# Patient Record
Sex: Female | Born: 1985
Health system: Southern US, Community
[De-identification: ages and names within clinical notes are randomized; demographics above are authoritative.]

## PROBLEM LIST (undated history)

## (undated) DIAGNOSIS — R569 Unspecified convulsions: Secondary | ICD-10-CM

## (undated) DIAGNOSIS — J45909 Unspecified asthma, uncomplicated: Secondary | ICD-10-CM

## (undated) DIAGNOSIS — F419 Anxiety disorder, unspecified: Secondary | ICD-10-CM

## (undated) DIAGNOSIS — U071 COVID-19: Secondary | ICD-10-CM

## (undated) DIAGNOSIS — Z8619 Personal history of other infectious and parasitic diseases: Secondary | ICD-10-CM

## (undated) DIAGNOSIS — N390 Urinary tract infection, site not specified: Secondary | ICD-10-CM

## (undated) DIAGNOSIS — Z8669 Personal history of other diseases of the nervous system and sense organs: Secondary | ICD-10-CM

## (undated) DIAGNOSIS — Z87410 Personal history of cervical dysplasia: Secondary | ICD-10-CM

## (undated) HISTORY — DX: Personal history of other diseases of the nervous system and sense organs: Z86.69

## (undated) HISTORY — DX: Urinary tract infection, site not specified: N39.0

## (undated) HISTORY — DX: Unspecified asthma, uncomplicated: J45.909

## (undated) HISTORY — DX: COVID-19: U07.1

## (undated) HISTORY — DX: Personal history of cervical dysplasia: Z87.410

## (undated) HISTORY — DX: Personal history of other infectious and parasitic diseases: Z86.19

## (undated) HISTORY — PX: WISDOM TOOTH EXTRACTION: SHX21

## (undated) HISTORY — DX: Anxiety disorder, unspecified: F41.9

## (undated) HISTORY — PX: TONSILLECTOMY: SHX5217

## (undated) HISTORY — DX: Unspecified convulsions: R56.9

---

## 2006-11-18 ENCOUNTER — Emergency Department: Payer: Self-pay | Admitting: Emergency Medicine

## 2008-10-26 ENCOUNTER — Emergency Department: Payer: Self-pay | Admitting: Emergency Medicine

## 2013-11-16 ENCOUNTER — Ambulatory Visit: Payer: BC Managed Care – PPO | Admitting: Family Medicine

## 2013-11-16 VITALS — BP 102/60 | HR 75 | Temp 99.4°F | Resp 18 | Ht 63.0 in | Wt 122.0 lb

## 2013-11-16 DIAGNOSIS — J329 Chronic sinusitis, unspecified: Secondary | ICD-10-CM

## 2013-11-16 MED ORDER — AMOXICILLIN 875 MG PO TABS: 875.0000 mg | ORAL_TABLET | Freq: Two times a day (BID) | ORAL | Status: DC

## 2013-11-16 NOTE — Progress Notes (Signed)
This chart was scribed for Elvina Sidle, MD by Arlan Organ, ED Scribe. This patient was seen in room Room 3 and the patient's care was started 10:50 AM.    Sarah West MRN: [id number], DOB: 1986-06-28, 28 y.o. Date of Encounter: 11/16/2013, 10:46 AM  Primary Physician: Provider Not In System  Chief Complaint:  Chief Complaint  Patient presents with  . head congestion and pressure    green drainage x6 days     HPI: 27 y.o. year old female presents with a 7 day history of nasal congestion, post nasal drip. Mild sinus pressure. Afebrile. No chills. Nasal congestion thick and green. Ears feel full, leading to sensation of muffled hearing. Has tried OTC cold preps mild success. No GI complaints. Appetite normal. She states she recalls these symptoms during the colder seasons.  No recent antibiotics, or recent travels. Pt states her Mother is experiencing similar symptoms.   No leg trauma, sedentary periods, h/o cancer. Pt states she smokes tobacco socially.  Pt is currently a hair stylist. History reviewed. No pertinent past medical history.   Home Meds: Prior to Admission medications   Medication Sig Start Date End Date Taking? Authorizing Provider  clonazePAM (KLONOPIN) 1 MG tablet Take 1 mg by mouth 2 (two) times daily.   Yes Historical Provider, MD    Allergies: No Known Allergies  History   Social History  . Marital Status: Married    Spouse Name: N/A    Number of Children: N/A  . Years of Education: N/A   Occupational History  . Not on file.   Social History Main Topics  . Smoking status: Never Smoker   . Smokeless tobacco: Not on file  . Alcohol Use: Not on file  . Drug Use: Not on file  . Sexual Activity: Not on file   Other Topics Concern  . Not on file   Social History Narrative  . No narrative on file     Review of Systems: Constitutional: negative for chills, fever, night sweats or weight changes Cardiovascular: negative for chest pain or  palpitations HENT: Positive for nasal congestion Respiratory: negative for hemoptysis, wheezing, or shortness of breath Abdominal: negative for abdominal pain, nausea, vomiting or diarrhea Dermatological: negative for rash Neurologic: negative for headache   Physical Exam: Blood pressure 102/60, pulse 75, temperature 99.4 F (37.4 C), temperature source Oral, resp. rate 18, height 5\' 3"  (1.6 m), weight 122 lb (55.339 kg), last menstrual period 11/04/2013, SpO2 100.00%., Body mass index is 21.62 kg/(m^2). General: Well developed, well nourished, in no acute distress. Head: Normocephalic, atraumatic, eyes without discharge, sclera non-icteric, nares are congested. Bilateral auditory canals clear, TM's are without perforation, pearly grey with reflective cone of light bilaterally. No sinus TTP. Oral cavity moist, dentition normal. Posterior pharynx with post nasal drip and mild erythema. No peritonsillar abscess or tonsillar exudate. Neck: Supple. No thyromegaly. Full ROM. No lymphadenopathy. Lungs: Coarse breath sounds bilaterally without wheezes, rales, or rhonchi. Breathing is unlabored.  Heart: RRR with S1 S2. No murmurs, rubs, or gallops appreciated. Msk:  Strength and tone normal for age. Extremities: No clubbing or cyanosis. No edema. Neuro: Alert and oriented X 3. Moves all extremities spontaneously. CNII-XII grossly in tact. Psych:  Responds to questions appropriately with a normal affect.  Nose reveals bilateral nasal congestion with hyperemia and diffuse scattered mucopurulent exudates   ASSESSMENT AND PLAN:  27 y.o. year old female with sinusitis Sinusitis - Plan: amoxicillin (AMOXIL) 875 MG tablet  - -Tylenol/Motrin prn -Rest/fluids -  RTC precautions -RTC 3-5 days if no improvement  Signed, Elvina Sidle, MD 11/16/2013 10:46 AM

## 2013-11-16 NOTE — Patient Instructions (Signed)

## 2016-10-21 ENCOUNTER — Ambulatory Visit: Payer: Self-pay | Admitting: Family

## 2016-10-21 VITALS — BP 120/70 | HR 78 | Temp 98.6°F

## 2016-10-21 DIAGNOSIS — J028 Acute pharyngitis due to other specified organisms: Principal | ICD-10-CM

## 2016-10-21 DIAGNOSIS — J309 Allergic rhinitis, unspecified: Secondary | ICD-10-CM | POA: Insufficient documentation

## 2016-10-21 DIAGNOSIS — B9789 Other viral agents as the cause of diseases classified elsewhere: Secondary | ICD-10-CM

## 2016-10-21 MED ORDER — MONTELUKAST SODIUM 10 MG PO TABS: 10.0000 mg | ORAL_TABLET | Freq: Every day | ORAL | 11 refills | Status: DC

## 2016-10-21 MED ORDER — FLUTICASONE PROPIONATE 50 MCG/ACT NA SUSP: 2.0000 | Freq: Every day | NASAL | 11 refills | Status: DC

## 2016-10-21 NOTE — Progress Notes (Signed)
S/ sore throat , hurts to swallow . Chronic year round allergies , no meds, malaise , denies URI , resp or GI sxs  PMH anxiety, taking hydroxizine for allergies and anxiety Hx of tonsilectomy remote O/ VSS ENT tms clear, nasal turbinates allergic  , pharynx one viral ulcer R side inflamed. Neck supple heart rsr lungs clear  A/ allergic rhinitis, viral pharyngitis P/  allergy tips reviewed and encouraged rx flonase , singulair . Supportive measures discussed. Follow up prn not improving   Analgesic, gargles, chloroseptic losenges.

## 2017-06-26 ENCOUNTER — Encounter: Payer: Self-pay | Admitting: Obstetrics and Gynecology

## 2017-06-30 ENCOUNTER — Encounter: Payer: Self-pay | Admitting: Obstetrics and Gynecology

## 2017-07-04 ENCOUNTER — Encounter: Payer: Self-pay | Admitting: Obstetrics and Gynecology

## 2017-07-04 ENCOUNTER — Ambulatory Visit (INDEPENDENT_AMBULATORY_CARE_PROVIDER_SITE_OTHER): Payer: 59 | Admitting: Obstetrics and Gynecology

## 2017-07-04 VITALS — BP 115/77 | HR 76 | Ht 62.0 in | Wt 147.0 lb

## 2017-07-04 DIAGNOSIS — Z87898 Personal history of other specified conditions: Secondary | ICD-10-CM

## 2017-07-04 DIAGNOSIS — Z01419 Encounter for gynecological examination (general) (routine) without abnormal findings: Secondary | ICD-10-CM | POA: Diagnosis not present

## 2017-07-04 DIAGNOSIS — Z720 Tobacco use: Secondary | ICD-10-CM

## 2017-07-04 DIAGNOSIS — Z8742 Personal history of other diseases of the female genital tract: Secondary | ICD-10-CM
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
## 2017-07-04 NOTE — Patient Instructions (Signed)

## 2017-07-05 ENCOUNTER — Encounter: Payer: Self-pay | Admitting: Obstetrics and Gynecology

## 2017-07-05 LAB — COMPREHENSIVE METABOLIC PANEL
ALT: 13 IU/L (ref 0–32)
AST: 16 IU/L (ref 0–40)
Albumin/Globulin Ratio: 1.9 (ref 1.2–2.2)
Albumin: 4.8 g/dL (ref 3.5–5.5)
Alkaline Phosphatase: 62 IU/L (ref 39–117)
BUN/Creatinine Ratio: 17 (ref 9–23)
BUN: 11 mg/dL (ref 6–20)
Bilirubin Total: 0.5 mg/dL (ref 0.0–1.2)
CO2: 22 mmol/L (ref 20–29)
CREATININE: 0.63 mg/dL (ref 0.57–1.00)
Calcium: 9.5 mg/dL (ref 8.7–10.2)
Chloride: 100 mmol/L (ref 96–106)
GFR calc Af Amer: 137 mL/min/{1.73_m2} (ref >59)
GFR, EST NON AFRICAN AMERICAN: 119 mL/min/{1.73_m2} (ref >59)
Globulin, Total: 2.5 g/dL (ref 1.5–4.5)
Glucose: 92 mg/dL (ref 65–99)
Potassium: 4.3 mmol/L (ref 3.5–5.2)
Sodium: 136 mmol/L (ref 134–144)
Total Protein: 7.3 g/dL (ref 6.0–8.5)

## 2017-07-05 LAB — CBC
Hematocrit: 41.5 % (ref 34.0–46.6)
Hemoglobin: 13.8 g/dL (ref 11.1–15.9)
MCH: 30.5 pg (ref 26.6–33.0)
MCHC: 33.3 g/dL (ref 31.5–35.7)
MCV: 92 fL (ref 79–97)
PLATELETS: 305 10*3/uL (ref 150–379)
RBC: 4.53 x10E6/uL (ref 3.77–5.28)
RDW: 13.6 % (ref 12.3–15.4)
WBC: 6.7 10*3/uL (ref 3.4–10.8)

## 2017-07-06 LAB — PAP IG AND HPV HIGH-RISK
HPV, high-risk: NEGATIVE
PAP Smear Comment: 0

## 2017-08-16 ENCOUNTER — Encounter: Payer: Self-pay | Admitting: Physician Assistant

## 2017-08-16 ENCOUNTER — Ambulatory Visit: Payer: Self-pay | Admitting: Physician Assistant

## 2017-08-16 VITALS — BP 112/68 | HR 87 | Temp 98.6°F

## 2017-08-16 DIAGNOSIS — H6983 Other specified disorders of Eustachian tube, bilateral: Secondary | ICD-10-CM

## 2017-08-16 MED ORDER — PREDNISONE 10 MG PO TABS: 30.0000 mg | ORAL_TABLET | Freq: Every day | ORAL | 0 refills | Status: DC

## 2017-08-16 NOTE — Progress Notes (Signed)
S: states she has diminished hearing, ?if wax build up or going deaf, states she has trouble with men's voices due to low tone, high tones are easier to hear, no drainage from the ears, no recent injury, no runny nose or congestion, gets a cough at night  O: vitals wnl, nad, tms dull b/l, no wax in ear canals, throat wnl, neck supple no lymph, lungs c t a, cv rrr, pt appears confused about instructions and had to repeat them several times  A: eustachean tube dysfunction, decreased hearing  P: pred 30mg  qd x 3d, flonase, sudafed

## 2017-12-13 ENCOUNTER — Ambulatory Visit: Payer: Self-pay | Admitting: Emergency Medicine

## 2017-12-13 VITALS — BP 110/80 | HR 76 | Temp 98.5°F

## 2017-12-13 DIAGNOSIS — J029 Acute pharyngitis, unspecified: Secondary | ICD-10-CM

## 2017-12-13 LAB — POCT RAPID STREP A (OFFICE): RAPID STREP A SCREEN: NEGATIVE

## 2017-12-13 MED ORDER — BENZONATATE 100 MG PO CAPS: 100.0000 mg | ORAL_CAPSULE | Freq: Three times a day (TID) | ORAL | 0 refills | Status: DC | PRN

## 2017-12-13 NOTE — Progress Notes (Signed)
Subjective. One week ago patient started with a problem with sinus congestion and a tickle in her throat. She had episodes of sneezing and continues to have the postnasal drip which causes a severe cough. Her cough is much worse at night. She has tried NyQuil with no relief. Her husband had a similar illness but he is better. She is a smoker down to 45 cigarettes a day now. She has an essentially nonproductive cough. Objective. HEENT exam is unremarkable. TMs are clear nose is normal. Neck is supple with mild right cervical adenopathy. Chest is clear to auscultation and percussion. Assessment. Patient has had a bad cough primarily at night. She has not responded to over-the-counter medication. I did pull up the PMP aware information and she is not on any narcotics. She was on alprazolam until September of last year.. Plan.  We'll check a strep test. She was given 2 ounces of Tussionex to take 1 teaspoon at bedtime. She will have Tessalon pearls for cough.

## 2018-02-23 ENCOUNTER — Telehealth: Payer: Self-pay | Admitting: Family

## 2018-02-23 DIAGNOSIS — N390 Urinary tract infection, site not specified: Secondary | ICD-10-CM

## 2018-02-23 MED ORDER — CEPHALEXIN 500 MG PO CAPS: 500.0000 mg | ORAL_CAPSULE | Freq: Two times a day (BID) | ORAL | 0 refills | Status: DC

## 2018-02-23 NOTE — Progress Notes (Signed)

## 2018-03-29 ENCOUNTER — Telehealth: Payer: 59 | Admitting: Family

## 2018-03-29 ENCOUNTER — Encounter: Payer: Self-pay | Admitting: Obstetrics and Gynecology

## 2018-03-29 DIAGNOSIS — N390 Urinary tract infection, site not specified: Secondary | ICD-10-CM

## 2018-03-29 MED ORDER — NITROFURANTOIN MONOHYD MACRO 100 MG PO CAPS: 100.0000 mg | ORAL_CAPSULE | Freq: Two times a day (BID) | ORAL | 0 refills | Status: DC

## 2018-03-29 NOTE — Progress Notes (Signed)
Thank you for the details you included in the comment boxes. Those details are very helpful in determining the best course of treatment for you and help us to provide the best care. We can try a macrolide antibiotic as this may be more effective in your case.   We are sorry that you are not feeling well.  Here is how we plan to help!  Based on what you shared with me it looks like you most likely have a simple urinary tract infection.  A UTI (Urinary Tract Infection) is a bacterial infection of the bladder.  Most cases of urinary tract infections are simple to treat but a key part of your care is to encourage you to drink plenty of fluids and watch your symptoms carefully.  I have prescribed MacroBid 100 mg twice a day for 5 days.  Your symptoms should gradually improve. Call us if the burning in your urine worsens, you develop worsening fever, back pain or pelvic pain or if your symptoms do not resolve after completing the antibiotic.  Urinary tract infections can be prevented by drinking plenty of water to keep your body hydrated.  Also be sure when you wipe, wipe from front to back and don't hold it in!  If possible, empty your bladder every 4 hours.  Your e-visit answers were reviewed by a board certified advanced clinical practitioner to complete your personal care plan.  Depending on the condition, your plan could have included both over the counter or prescription medications.  If there is a problem please reply  once you have received a response from your provider.  Your safety is important to us.  If you have drug allergies check your prescription carefully.    You can use MyChart to ask questions about today's visit, request a non-urgent call back, or ask for a work or school excuse for 24 hours related to this e-Visit. If it has been greater than 24 hours you will need to follow up with your provider, or enter a new e-Visit to address those concerns.   You will get an e-mail in the  next two days asking about your experience.  I hope that your e-visit has been valuable and will speed your recovery. Thank you for using e-visits.

## 2018-04-05 ENCOUNTER — Encounter: Payer: Self-pay | Admitting: Obstetrics and Gynecology

## 2018-04-24 ENCOUNTER — Ambulatory Visit (INDEPENDENT_AMBULATORY_CARE_PROVIDER_SITE_OTHER): Payer: 59 | Admitting: Obstetrics and Gynecology

## 2018-04-24 ENCOUNTER — Encounter: Payer: Self-pay | Admitting: Obstetrics and Gynecology

## 2018-04-24 ENCOUNTER — Encounter: Payer: Self-pay | Admitting: *Deleted

## 2018-04-24 VITALS — BP 103/68 | HR 79 | Ht 62.0 in | Wt 144.6 lb

## 2018-04-24 DIAGNOSIS — Z1389 Encounter for screening for other disorder: Secondary | ICD-10-CM

## 2018-04-24 DIAGNOSIS — R300 Dysuria: Secondary | ICD-10-CM

## 2018-04-24 LAB — POCT URINALYSIS DIPSTICK
BILIRUBIN UA: NEGATIVE
GLUCOSE UA: NEGATIVE
Ketones, UA: NEGATIVE
Leukocytes, UA: NEGATIVE
Nitrite, UA: NEGATIVE
RBC UA: NEGATIVE
SPEC GRAV UA: 1.02 (ref 1.010–1.025)
Urobilinogen, UA: 0.2 E.U./dL
pH, UA: 6.5 (ref 5.0–8.0)

## 2018-04-24 NOTE — Patient Instructions (Signed)

## 2018-04-24 NOTE — Progress Notes (Signed)
    GYNECOLOGY PROGRESS NOTE  Subjective:    Patient ID: Sarah West, female    DOB: Jul 09, 1986, 32 y.o.   MRN: [id number]  HPI  Patient is a 32 y.o. G0P0000 female who presents for complaints of chronic dysuria. She notes that over the past 2-3 months she has noted pain with urination.  Pain is mostly noted at time of urination, relieved after voiding.  She has taken several rounds of prophylactic antibiotics for suspected UTI's (did Telemedicine visits).  Notes that she usually feels better for several days to a week after treatment, but then symptoms return.  She has increased fluids, taken cranberry extract, and has used Pyridium in the past without relief.  She also was given a prescription for Diflucan to see if this would treat, but no relief. Denies hematuria.   The following portions of the patient's history were reviewed and updated as appropriate: allergies, current medications, past family history, past medical history, past social history, past surgical history and problem list.  Review of Systems Pertinent items noted in HPI and remainder of comprehensive ROS otherwise negative.   Objective:   Blood pressure 103/68, pulse 79, height  (1.575 m), weight 144 lb 9.6 oz (65.6 kg), last menstrual period 04/14/2018. General appearance: alert and no distress Abdomen: soft, non-tender, no masses Back: no CVA tenderness, normal curvature.     Labs:  Results for orders placed or performed in visit on 04/24/18  POCT urinalysis dipstick  Result Value Ref Range   Color, UA yellow    Clarity, UA clear    Glucose, UA neg    Bilirubin, UA neg    Ketones, UA neg    Spec Grav, UA 1.020 1.010 - 1.025   Blood, UA neg    pH, UA 6.5 5.0 - 8.0   Protein, UA trace    Urobilinogen, UA 0.2 0.2 or 1.0 E.U./dL   Nitrite, UA neg    Leukocytes, UA Negative Negative   Appearance yellow    Odor       Assessment:   Dysuria (chronic)  Plan:   - Patient with chronic dysuria, has  been treated prophylactically for UTI's at least 2-3 times in the past several months, with no relief in symptoms. Has also used cranberry extract, pyridium, and increased hydration without relief.  UA today with no pathogens. Although negative UA, will still send culture due to persistent nature of symptoms. Will assess for possible genitourinary infections, including GC/Cl. Will also place referral to Urology.    Hildred Laser, MD [facility]

## 2018-04-24 NOTE — Progress Notes (Signed)
Pt think she may have a UTI have token several antibiotics but they are not helping still having burning with urination x 3 months. Pt took Diflucan once help with itching of the vaginal area but still has burning with urination.

## 2018-05-23 DIAGNOSIS — R42 Dizziness and giddiness: Secondary | ICD-10-CM | POA: Diagnosis not present

## 2018-05-24 ENCOUNTER — Encounter: Payer: Self-pay | Admitting: Neurology

## 2018-05-28 ENCOUNTER — Encounter: Payer: Self-pay | Admitting: Internal Medicine

## 2018-05-28 ENCOUNTER — Ambulatory Visit (INDEPENDENT_AMBULATORY_CARE_PROVIDER_SITE_OTHER): Payer: 59 | Admitting: Internal Medicine

## 2018-05-28 VITALS — BP 118/68 | HR 71 | Temp 98.5°F | Ht 62.0 in | Wt 140.0 lb

## 2018-05-28 DIAGNOSIS — G43909 Migraine, unspecified, not intractable, without status migrainosus: Secondary | ICD-10-CM | POA: Insufficient documentation

## 2018-05-28 DIAGNOSIS — F411 Generalized anxiety disorder: Secondary | ICD-10-CM

## 2018-05-28 DIAGNOSIS — R5383 Other fatigue: Secondary | ICD-10-CM | POA: Diagnosis not present

## 2018-05-28 DIAGNOSIS — G43809 Other migraine, not intractable, without status migrainosus: Secondary | ICD-10-CM

## 2018-05-28 DIAGNOSIS — Z13220 Encounter for screening for lipoid disorders: Secondary | ICD-10-CM | POA: Diagnosis not present

## 2018-05-28 DIAGNOSIS — H547 Unspecified visual loss: Secondary | ICD-10-CM | POA: Diagnosis not present

## 2018-05-28 DIAGNOSIS — E559 Vitamin D deficiency, unspecified: Secondary | ICD-10-CM

## 2018-05-28 DIAGNOSIS — Z1329 Encounter for screening for other suspected endocrine disorder: Secondary | ICD-10-CM

## 2018-05-28 DIAGNOSIS — R300 Dysuria: Secondary | ICD-10-CM

## 2018-05-28 DIAGNOSIS — Z0000 Encounter for general adult medical examination without abnormal findings: Secondary | ICD-10-CM

## 2018-05-28 DIAGNOSIS — R110 Nausea: Secondary | ICD-10-CM | POA: Diagnosis not present

## 2018-05-28 HISTORY — DX: Generalized anxiety disorder: F41.1

## 2018-05-28 LAB — LIPID PANEL
CHOL/HDL RATIO: 3
Cholesterol: 164 mg/dL (ref 0–200)
HDL: 50 mg/dL (ref >39.00)
LDL Cholesterol: 100 mg/dL — ABNORMAL HIGH (ref 0–99)
NONHDL: 114.11
TRIGLYCERIDES: 72 mg/dL (ref 0.0–149.0)
VLDL: 14.4 mg/dL (ref 0.0–40.0)

## 2018-05-28 LAB — COMPREHENSIVE METABOLIC PANEL
ALT: 12 U/L (ref 0–35)
AST: 13 U/L (ref 0–37)
Albumin: 4.4 g/dL (ref 3.5–5.2)
Alkaline Phosphatase: 57 U/L (ref 39–117)
BUN: 11 mg/dL (ref 6–23)
CHLORIDE: 103 meq/L (ref 96–112)
CO2: 27 mEq/L (ref 19–32)
Calcium: 9.6 mg/dL (ref 8.4–10.5)
Creatinine, Ser: 0.59 mg/dL (ref 0.40–1.20)
GFR: 124.71 mL/min (ref >60.00)
GLUCOSE: 93 mg/dL (ref 70–99)
POTASSIUM: 4 meq/L (ref 3.5–5.1)
SODIUM: 137 meq/L (ref 135–145)
Total Bilirubin: 0.4 mg/dL (ref 0.2–1.2)
Total Protein: 6.9 g/dL (ref 6.0–8.3)

## 2018-05-28 LAB — CBC WITH DIFFERENTIAL/PLATELET
BASOS PCT: 0.9 % (ref 0.0–3.0)
Basophils Absolute: 0.1 10*3/uL (ref 0.0–0.1)
EOS PCT: 4.5 % (ref 0.0–5.0)
Eosinophils Absolute: 0.3 10*3/uL (ref 0.0–0.7)
HCT: 40.1 % (ref 36.0–46.0)
HEMOGLOBIN: 13.4 g/dL (ref 12.0–15.0)
LYMPHS ABS: 2 10*3/uL (ref 0.7–4.0)
Lymphocytes Relative: 32.4 % (ref 12.0–46.0)
MCHC: 33.5 g/dL (ref 30.0–36.0)
MCV: 91.8 fl (ref 78.0–100.0)
MONO ABS: 0.4 10*3/uL (ref 0.1–1.0)
Monocytes Relative: 6.3 % (ref 3.0–12.0)
NEUTROS PCT: 55.9 % (ref 43.0–77.0)
Neutro Abs: 3.5 10*3/uL (ref 1.4–7.7)
Platelets: 336 10*3/uL (ref 150.0–400.0)
RBC: 4.36 Mil/uL (ref 3.87–5.11)
RDW: 13 % (ref 11.5–15.5)
WBC: 6.3 10*3/uL (ref 4.0–10.5)

## 2018-05-28 LAB — TSH: TSH: 1.26 u[IU]/mL (ref 0.35–4.50)

## 2018-05-28 LAB — POCT URINE PREGNANCY: Preg Test, Ur: NEGATIVE

## 2018-05-28 LAB — T4, FREE: Free T4: 1 ng/dL (ref 0.60–1.60)

## 2018-05-28 LAB — VITAMIN D 25 HYDROXY (VIT D DEFICIENCY, FRACTURES): VITD: 28.45 ng/mL — AB (ref 30.00–100.00)

## 2018-05-28 MED ORDER — ONDANSETRON HCL 4 MG PO TABS: 4.0000 mg | ORAL_TABLET | Freq: Three times a day (TID) | ORAL | 2 refills | Status: DC | PRN

## 2018-05-28 NOTE — Progress Notes (Signed)
Pre visit review using our clinic review tool, if applicable. No additional management support is needed unless otherwise documented below in the visit note. 
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
## 2018-05-28 NOTE — Patient Instructions (Addendum)
F/u in 1 month sooner if needed   Dysuria Dysuria is pain or discomfort while urinating. The pain or discomfort may be felt in the tube that carries urine out of the bladder (urethra) or in the surrounding tissue of the genitals. The pain may also be felt in the groin area, lower abdomen, and lower back. You may have to urinate frequently or have the sudden feeling that you have to urinate (urgency). Dysuria can affect both men and women, but is more common in women. Dysuria can be caused by many different things, including:  Urinary tract infection in women.  Infection of the kidney or bladder.  Kidney stones or bladder stones.  Certain sexually transmitted infections (STIs), such as chlamydia.  Dehydration.  Inflammation of the vagina.  Use of certain medicines.  Use of certain soaps or scented products that cause irritation.  Follow these instructions at home: Watch your dysuria for any changes. The following actions may help to reduce any discomfort you are feeling:  Drink enough fluid to keep your urine clear or pale yellow.  Empty your bladder often. Avoid holding urine for long periods of time.  After a bowel movement or urination, women should cleanse from front to back, using each tissue only once.  Empty your bladder after sexual intercourse.  Take medicines only as directed by your health care provider.  If you were prescribed an antibiotic medicine, finish it all even if you start to feel better.  Avoid caffeine, tea, and alcohol. They can irritate the bladder and make dysuria worse. In men, alcohol may irritate the prostate.  Keep all follow-up visits as directed by your health care provider. This is important.  If you had any tests done to find the cause of dysuria, it is your responsibility to obtain your test results. Ask the lab or department performing the test when and how you will get your results. Talk with your health care provider if you have any  questions about your results.  Contact a health care provider if:  You develop pain in your back or sides.  You have a fever.  You have nausea or vomiting.  You have blood in your urine.  You are not urinating as often as you usually do. Get help right away if:  You pain is severe and not relieved with medicines.  You are unable to hold down any fluids.  You or someone else notices a change in your mental function.  You have a rapid heartbeat at rest.  You have shaking or chills.  You feel extremely weak. This information is not intended to replace advice given to you by your health care provider. Make sure you discuss any questions you have with your health care provider. Document Released: 09/02/2004 Document Revised: 05/12/2016 Document Reviewed: 07/31/2014 Elsevier Interactive Patient Education  2018 [employer].  Dizziness Dizziness is a common problem. It is a feeling of unsteadiness or light-headedness. You may feel like you are about to faint. Dizziness can lead to injury if you stumble or fall. Anyone can become dizzy, but dizziness is more common in older adults. This condition can be caused by a number of things, including medicines, dehydration, or illness. Follow these instructions at home: Eating and drinking  Drink enough fluid to keep your urine clear or pale yellow. This helps to keep you from becoming dehydrated. Try to drink more clear fluids, such as water.  Do not drink alcohol.  Limit your caffeine intake if told to do  so by your health care provider. Check ingredients and nutrition facts to see if a food or beverage contains caffeine.  Limit your salt (sodium) intake if told to do so by your health care provider. Check ingredients and nutrition facts to see if a food or beverage contains sodium. Activity  Avoid making quick movements. ? Rise slowly from chairs and steady yourself until you feel okay. ? In the morning, first sit up on the side of  the bed. When you feel okay, stand slowly while you hold onto something until you know that your balance is fine.  If you need to stand in one place for a long time, move your legs often. Tighten and relax the muscles in your legs while you are standing.  Do not drive or use heavy machinery if you feel dizzy.  Avoid bending down if you feel dizzy. Place items in your home so that they are easy for you to reach without leaning over. Lifestyle  Do not use any products that contain nicotine or tobacco, such as cigarettes and e-cigarettes. If you need help quitting, ask your health care provider.  Try to reduce your stress level by using methods such as yoga or meditation. Talk with your health care provider if you need help to manage your stress. General instructions  Watch your dizziness for any changes.  Take over-the-counter and prescription medicines only as told by your health care provider. Talk with your health care provider if you think that your dizziness is caused by a medicine that you are taking.  Tell a friend or a family member that you are feeling dizzy. If he or she notices any changes in your behavior, have this person call your health care provider.  Keep all follow-up visits as told by your health care provider. This is important. Contact a health care provider if:  Your dizziness does not go away.  Your dizziness or light-headedness gets worse.  You feel nauseous.  You have reduced hearing.  You have new symptoms.  You are unsteady on your feet or you feel like the room is spinning. Get help right away if:  You vomit or have diarrhea and are unable to eat or drink anything.  You have problems talking, walking, swallowing, or using your arms, hands, or legs.  You feel generally weak.  You are not thinking clearly or you have trouble forming sentences. It may take a friend or family member to notice this.  You have chest pain, abdominal pain, shortness of  breath, or sweating.  Your vision changes.  You have any bleeding.  You have a severe headache.  You have neck pain or a stiff neck.  You have a fever. These symptoms may represent a serious problem that is an emergency. Do not wait to see if the symptoms will go away. Get medical help right away. Call your local emergency services (911 in the [place] Do not drive yourself to the hospital. Summary  Dizziness is a feeling of unsteadiness or light-headedness. This condition can be caused by a number of things, including medicines, dehydration, or illness.  Anyone can become dizzy, but dizziness is more common in older adults.  Drink enough fluid to keep your urine clear or pale yellow. Do not drink alcohol.  Avoid making quick movements if you feel dizzy. Monitor your dizziness for any changes. This information is not intended to replace advice given to you by your health care provider. Make sure you discuss any questions you  have with your health care provider. Document Released: 05/31/2001 Document Revised: 01/07/2017 Document Reviewed: 01/07/2017 Elsevier Interactive Patient Education  [employer].

## 2018-05-29 LAB — URINALYSIS, ROUTINE W REFLEX MICROSCOPIC
Bilirubin, UA: NEGATIVE
GLUCOSE, UA: NEGATIVE
KETONES UA: NEGATIVE
LEUKOCYTES UA: NEGATIVE
NITRITE UA: NEGATIVE
Protein, UA: NEGATIVE
RBC, UA: NEGATIVE
SPEC GRAV UA: 1.013 (ref 1.005–1.030)
Urobilinogen, Ur: 0.2 mg/dL (ref 0.2–1.0)
pH, UA: 8 — ABNORMAL HIGH (ref 5.0–7.5)

## 2018-05-30 LAB — URINE CULTURE

## 2018-05-31 ENCOUNTER — Ambulatory Visit (INDEPENDENT_AMBULATORY_CARE_PROVIDER_SITE_OTHER): Payer: 59 | Admitting: Urology

## 2018-05-31 ENCOUNTER — Encounter: Payer: Self-pay | Admitting: Urology

## 2018-05-31 VITALS — BP 112/80 | HR 93 | Ht 62.0 in | Wt 139.2 lb

## 2018-05-31 DIAGNOSIS — R300 Dysuria: Secondary | ICD-10-CM

## 2018-05-31 DIAGNOSIS — R109 Unspecified abdominal pain: Secondary | ICD-10-CM

## 2018-05-31 LAB — MICROSCOPIC EXAMINATION
RBC, UA: NONE SEEN /hpf (ref 0–2)
WBC UA: NONE SEEN /HPF (ref 0–5)

## 2018-05-31 LAB — URINALYSIS, COMPLETE
BILIRUBIN UA: NEGATIVE
GLUCOSE, UA: NEGATIVE
KETONES UA: NEGATIVE
Leukocytes, UA: NEGATIVE
Nitrite, UA: NEGATIVE
Protein, UA: NEGATIVE
RBC UA: NEGATIVE
SPEC GRAV UA: 1.015 (ref 1.005–1.030)
UUROB: 0.2 mg/dL (ref 0.2–1.0)
pH, UA: 6 (ref 5.0–7.5)

## 2018-06-01 ENCOUNTER — Encounter: Payer: Self-pay | Admitting: Internal Medicine

## 2018-06-01 NOTE — Progress Notes (Signed)
05/31/2018 6:51 AM   [name] [birth year] [id number]  Referring provider: Hildred Laserherry, Anika, MD [address] [facility] [address] [place], [place] [id number]  Chief Complaint  Patient presents with  . Dysuria    HPI: Sarah West is a 32 year old female seen in consultation at the request of Dr. Valentino Saxonherry for evaluation of chronic dysuria.  She states shortly after the first of the year she had onset of dysuria.  She only has symptoms when voiding and complains of burning.  She has mild suprapubic discomfort.  She has taken cranberry tablets, AZO, Pyridium without relief.  She has also been treated empirically with antibiotics and Diflucan without relief of her symptoms.  She denies gross hematuria.  Last week she began to note right flank pain with some radiation to the right lower quadrant.  There is no previous history of urologic problems.  Urinalyses and urine cultures have been negative.  She denies dyspareunia or urinary frequency/urgency.  Gynecologic evaluation has been negative.   PMH: Past Medical History:  Diagnosis Date  . Anxiety   . Asthma   . History of cervical dysplasia    s/p cryotherapy 8 years ago  . History of genital warts   . Hx of migraines    teenager   . Seizure (HCC)    grand mal  . UTI (urinary tract infection)     Surgical History: Past Surgical History:  Procedure Laterality Date  . TONSILLECTOMY     2009  . WISDOM TOOTH EXTRACTION      Home Medications:  Allergies as of 05/31/2018   No Known Allergies     Medication List        Accurate as of 05/31/18 11:59 PM. Always use your most recent med list.          meclizine 25 MG tablet Commonly known as:  ANTIVERT Take 25 mg by mouth 3 (three) times daily.   ondansetron 4 MG tablet Commonly known as:  ZOFRAN Take 1 tablet (4 mg total) by mouth every 8 (eight) hours as needed for nausea or vomiting.       Allergies: No Known Allergies  Family History: Family History  Problem  Relation Age of Onset  . Asthma Mother   . Breast cancer Maternal Grandmother   . Cancer Maternal Grandmother        breast  . Stroke Maternal Grandmother   . Heart disease Paternal Grandfather   . Cancer Maternal Aunt   . Cervical cancer Other     Social History:  reports that she has been smoking cigarettes.  She has been smoking about 0.25 packs per day. She has never used smokeless tobacco. She reports that she drinks about 1.2 oz of alcohol per week. She reports that she does not use drugs.  ROS: UROLOGY Frequent Urination?: No Hard to postpone urination?: No Burning/pain with urination?: Yes Get up at night to urinate?: Yes Leakage of urine?: No Urine stream starts and stops?: No Trouble starting stream?: No Do you have to strain to urinate?: Yes Blood in urine?: No Urinary tract infection?: Yes Sexually transmitted disease?: No Injury to kidneys or bladder?: No Painful intercourse?: No Weak stream?: No Currently pregnant?: No Vaginal bleeding?: No Last menstrual period?: 05/23/2018  Gastrointestinal Nausea?: No Vomiting?: No Indigestion/heartburn?: No Diarrhea?: Yes Constipation?: Yes  Constitutional Fever: No Night sweats?: Yes Weight loss?: No Fatigue?: Yes  Skin Skin rash/lesions?: No Itching?: No  Eyes Blurred vision?: Yes Double vision?: No  Ears/Nose/Throat Sore throat?:  Yes Sinus problems?: Yes  Hematologic/Lymphatic Swollen glands?: Yes Easy bruising?: Yes  Cardiovascular Leg swelling?: No Chest pain?: No  Respiratory Cough?: Yes Shortness of breath?: No  Endocrine Excessive thirst?: No  Musculoskeletal Back pain?: Yes Joint pain?: Yes  Neurological Headaches?: Yes Dizziness?: Yes  Psychologic Depression?: No Anxiety?: Yes  Physical Exam: BP 112/80 (BP Location: Left Arm, Patient Position: Sitting, Cuff Size: Normal)   Pulse 93   Ht 5\' 2"  (1.575 m)   Wt 139 lb 3.2 oz (63.1 kg)   LMP 05/23/2018   BMI 25.46 kg/m     Constitutional:  Alert and oriented, No acute distress. HEENT: [place] AT, moist mucus membranes.  Trachea midline, no masses. Cardiovascular: No clubbing, cyanosis, or edema. Respiratory: Normal respiratory effort, no increased work of breathing. GI: Abdomen is soft, nontender, nondistended, no abdominal masses GU: No CVA tenderness Lymph: No cervical or inguinal lymphadenopathy. Skin: No rashes, bruises or suspicious lesions. Neurologic: Grossly intact, no focal deficits, moving all 4 extremities. Psychiatric: Normal mood and affect.  Laboratory Data:  Urinalysis Dipstick/microscopy negative   Assessment & Plan:   32 year old female with chronic dysuria.  Urinalysis today is unremarkable.  She has recently developed some right flank pain.  Have recommended scheduling a CT urogram and cystoscopy for further evaluation.  She was given Uribel samples and literature dietary modifications related to voiding symptoms.   Riki Altes, MD  [facility] [address], [address] [place], [place] [address] [phone]

## 2018-06-03 ENCOUNTER — Encounter: Payer: Self-pay | Admitting: Urology

## 2018-06-04 ENCOUNTER — Telehealth: Payer: Self-pay | Admitting: Internal Medicine

## 2018-06-04 NOTE — Telephone Encounter (Signed)
Paperwork placed in provider box °

## 2018-06-04 NOTE — Telephone Encounter (Signed)
Pt dropped off FMLA papers to be completed. Pt has also attached a note for Dr. Jearld PiesMcClean. Paper work with note is in Dr. Gayland CurryMcClean's colored folder.

## 2018-06-06 ENCOUNTER — Encounter: Payer: Self-pay | Admitting: Urology

## 2018-06-07 ENCOUNTER — Encounter: Payer: Self-pay | Admitting: Internal Medicine

## 2018-06-08 ENCOUNTER — Ambulatory Visit (INDEPENDENT_AMBULATORY_CARE_PROVIDER_SITE_OTHER): Payer: 59 | Admitting: Neurology

## 2018-06-08 ENCOUNTER — Encounter: Payer: Self-pay | Admitting: Neurology

## 2018-06-08 ENCOUNTER — Other Ambulatory Visit: Payer: Self-pay

## 2018-06-08 VITALS — BP 104/68 | HR 82 | Ht 62.0 in | Wt 139.0 lb

## 2018-06-08 DIAGNOSIS — R569 Unspecified convulsions: Secondary | ICD-10-CM | POA: Diagnosis not present

## 2018-06-08 DIAGNOSIS — R519 Headache, unspecified: Secondary | ICD-10-CM

## 2018-06-08 DIAGNOSIS — R51 Headache: Secondary | ICD-10-CM | POA: Diagnosis not present

## 2018-06-08 MED ORDER — NORTRIPTYLINE HCL 10 MG PO CAPS: 10.0000 mg | ORAL_CAPSULE | Freq: Every day | ORAL | 6 refills | Status: DC
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
## 2018-06-08 NOTE — Patient Instructions (Signed)
1. Schedule MRI brain with and without contrast 2. Schedule 1-hour sleep-deprived EEG, if normal we will schedule a 24-hour EEG 3. Start nortriptyline 10mg  at bedtime 4. Start minimizing Tylenol/Ibuprofen to 2-3 times a week, otherwise taking it more can worsen headaches 5. Follow-up after tests  Seizure Precautions: 1. If medication has been prescribed for you to prevent seizures, take it exactly as directed.  Do not stop taking the medicine without talking to your doctor first, even if you have not had a seizure in a long time.   2. Avoid activities in which a seizure would cause danger to yourself or to others.  Don't operate dangerous machinery, swim alone, or climb in high or dangerous places, such as on ladders, roofs, or girders.  Do not drive unless your doctor says you may.  3. If you have any warning that you may have a seizure, lay down in a safe place where you can't hurt yourself.    4.  No driving for 6 months from last seizure, as per [facility]  [place] law.   Please refer to the following link on the [employer]'s website for more information: http://www.epilepsyfoundation.org/answerplace/Social/driving/drivingu.cfm   5.  Maintain good sleep hygiene.Avoid alcohol  6.  Notify your neurology if you are planning pregnancy or if you become pregnant.  7.  Contact your doctor if you have any problems that may be related to the medicine you are taking.  8.  Call 911 and bring the patient back to the ED if:        A.  The seizure lasts longer than 5 minutes.       B.  The patient doesn't awaken shortly after the seizure  C.  The patient has new problems such as difficulty seeing, speaking or moving  D.  The patient was injured during the seizure  E.  The patient has a temperature over 102 F (39C)  F.  The patient vomited and now is having trouble breathing

## 2018-06-12 DIAGNOSIS — Z0279 Encounter for issue of other medical certificate: Secondary | ICD-10-CM

## 2018-06-13 ENCOUNTER — Ambulatory Visit (INDEPENDENT_AMBULATORY_CARE_PROVIDER_SITE_OTHER): Payer: 59 | Admitting: Neurology

## 2018-06-13 DIAGNOSIS — R569 Unspecified convulsions: Secondary | ICD-10-CM | POA: Diagnosis not present

## 2018-06-13 DIAGNOSIS — R519 Headache, unspecified: Secondary | ICD-10-CM

## 2018-06-13 DIAGNOSIS — R51 Headache: Secondary | ICD-10-CM

## 2018-06-15 ENCOUNTER — Ambulatory Visit (HOSPITAL_COMMUNITY)
Admission: RE | Admit: 2018-06-15 | Discharge: 2018-06-15 | Disposition: A | Discharge status: Home / Self Care | Payer: 59 | Source: Ambulatory Visit | Attending: Neurology | Admitting: Neurology

## 2018-06-15 DIAGNOSIS — R569 Unspecified convulsions: Secondary | ICD-10-CM | POA: Diagnosis not present

## 2018-06-15 DIAGNOSIS — R51 Headache: Secondary | ICD-10-CM | POA: Insufficient documentation

## 2018-06-15 DIAGNOSIS — G40909 Epilepsy, unspecified, not intractable, without status epilepticus: Secondary | ICD-10-CM | POA: Diagnosis not present

## 2018-06-15 DIAGNOSIS — R519 Headache, unspecified: Secondary | ICD-10-CM

## 2018-06-15 MED ORDER — GADOBENATE DIMEGLUMINE 529 MG/ML IV SOLN
15.0000 mL | Freq: Once | INTRAVENOUS | Status: AC
  Administered 2018-06-15: 15 mL via INTRAVENOUS

## 2018-06-19 NOTE — Procedures (Signed)
ELECTROENCEPHALOGRAM REPORT  Date of Study: 06/19/2018  Patient's Name: Sarah West MRN: [id number] Date of Birth: [birth year]  Referring Provider: Dr. Patrcia DollyKaren Shulamis Wenberg  Clinical History: This is a 32 year old woman with witnessed GTC in 04/2018. Since then she has had headaches, dizziness, vision changes.  Medications: Antivert, Zofran  Technical Summary: A multichannel digital 1-hour  EEG recording measured by the international 10-20 system with electrodes applied with paste and impedances below 5000 ohms performed in our laboratory with EKG monitoring in an awake and asleep patient.  Hyperventilation and photic stimulation were performed.  The digital EEG was referentially recorded, reformatted, and digitally filtered in a variety of bipolar and referential montages for optimal display.    Description: The patient is awake and asleep during the recording.  During maximal wakefulness, there is a symmetric, medium voltage 10 Hz posterior dominant rhythm that attenuates with eye opening.  The record is symmetric.  During drowsiness and sleep, there is an increase in theta slowing of the background, at times sharply contoured over the left temporal region without clear epileptogenic potential. Vertex waves and symmetric sleep spindles were seen.  Hyperventilation and photic stimulation did not elicit any abnormalities.  There were no epileptiform discharges or electrographic seizures seen.    EKG lead was unremarkable.  Impression: This 1-hour awake and asleep EEG is normal.    Clinical Correlation: A normal EEG does not exclude a clinical diagnosis of epilepsy.  If further clinical questions remain, prolonged EEG may be helpful.  Clinical correlation is advised.   Patrcia DollyKaren Gevon Markus, M.D.

## 2018-06-20 ENCOUNTER — Telehealth: Payer: Self-pay

## 2018-06-20 NOTE — Telephone Encounter (Signed)
-----   Message from Van ClinesKaren M Aquino, MD sent at 06/19/2018  2:06 PM EDT ----- Pls let her know the EEG and MRI brain were normal. Thanks

## 2018-06-20 NOTE — Telephone Encounter (Signed)
Spoke with pt relaying message below.   

## 2018-06-27 ENCOUNTER — Other Ambulatory Visit: Payer: Self-pay

## 2018-07-04 ENCOUNTER — Encounter: Payer: Self-pay | Admitting: Urology

## 2018-07-04 DIAGNOSIS — R109 Unspecified abdominal pain: Secondary | ICD-10-CM

## 2018-07-04 DIAGNOSIS — R300 Dysuria: Secondary | ICD-10-CM

## 2018-07-05 ENCOUNTER — Encounter: Payer: 59 | Admitting: Obstetrics and Gynecology

## 2018-07-11 ENCOUNTER — Encounter: Payer: 59 | Admitting: Obstetrics and Gynecology

## 2018-07-12 ENCOUNTER — Ambulatory Visit: Payer: Self-pay | Admitting: Internal Medicine

## 2018-07-16 ENCOUNTER — Ambulatory Visit
Admission: RE | Admit: 2018-07-16 | Discharge: 2018-07-16 | Disposition: A | Discharge status: Home / Self Care | Payer: 59 | Source: Ambulatory Visit | Attending: Urology | Admitting: Urology

## 2018-07-16 ENCOUNTER — Other Ambulatory Visit: Payer: Self-pay

## 2018-07-16 DIAGNOSIS — R10A0 Flank pain, unspecified side: Secondary | ICD-10-CM

## 2018-07-16 DIAGNOSIS — R109 Unspecified abdominal pain: Secondary | ICD-10-CM | POA: Insufficient documentation

## 2018-07-16 DIAGNOSIS — R300 Dysuria: Secondary | ICD-10-CM

## 2018-07-16 MED ORDER — IOPAMIDOL (ISOVUE-300) INJECTION 61%
150.0000 mL | Freq: Once | INTRAVENOUS | Status: AC | PRN
  Administered 2018-07-16: 125 mL via INTRAVENOUS

## 2018-07-17 ENCOUNTER — Encounter: Payer: Self-pay | Admitting: Urology

## 2018-07-17 ENCOUNTER — Ambulatory Visit (INDEPENDENT_AMBULATORY_CARE_PROVIDER_SITE_OTHER): Payer: 59 | Admitting: Urology

## 2018-07-17 VITALS — BP 107/72 | HR 73 | Ht 62.0 in | Wt 141.8 lb

## 2018-07-17 DIAGNOSIS — B373 Candidiasis of vulva and vagina: Secondary | ICD-10-CM

## 2018-07-17 DIAGNOSIS — R300 Dysuria: Secondary | ICD-10-CM

## 2018-07-17 DIAGNOSIS — B3731 Acute candidiasis of vulva and vagina: Secondary | ICD-10-CM

## 2018-07-17 MED ORDER — AMITRIPTYLINE HCL 10 MG PO TABS: 10.0000 mg | ORAL_TABLET | Freq: Every day | ORAL | 0 refills | Status: DC

## 2018-07-17 MED ORDER — FLUCONAZOLE 150 MG PO TABS: 150.0000 mg | ORAL_TABLET | Freq: Once | ORAL | 0 refills | Status: AC

## 2018-07-17 NOTE — Progress Notes (Signed)
   07/17/18  CC:  Chief Complaint  Patient presents with  . Cysto    HPI: Refer to my previous note dated 05/31/2018.  Her right flank pain has resolved.  She could not tolerate Uribel secondary to GI side effects.  CT urogram showed no abnormalities.  Blood pressure 107/72, pulse 73, height 5\' 2"  (1.575 m), weight 141 lb 12.8 oz (64.3 kg). NED. A&Ox3.   No respiratory distress   Abd soft, NT, ND Normal external genitalia with patent urethral meatus.  No bladder/urethral tenderness or masses.  No significant pelvic floor tenderness.  Cystoscopy Procedure Note  Patient identification was confirmed, informed consent was obtained, and patient was prepped using Betadine solution.  Lidocaine jelly was administered per urethral meatus.    Preoperative abx where received prior to procedure.    Procedure: - Flexible cystoscope introduced, without any difficulty.   - Thorough search of the bladder revealed:    normal urethral meatus    normal urothelium    no stones    no ulcers     no tumors    no urethral polyps or inflammatory changes    no trabeculation  - Ureteral orifices were normal in position and appearance.  Post-Procedure: - Patient tolerated the procedure well  Assessment/ Plan: Normal CTU and cystoscopy.  No etiology is identified for her chronic dysuria.  Although her pain is not consistent with neuropathic pain we will give a trial of low-dose amitriptyline 10 mg nightly.   Riki AltesScott C Stoioff, MD

## 2018-07-17 NOTE — Addendum Note (Signed)
Addended by: Frankey ShownGLANTON, Dyshon Philbin C on: 07/17/2018 02:50 PM   Modules accepted: Orders

## 2018-07-18 LAB — URINALYSIS, COMPLETE
BILIRUBIN UA: NEGATIVE
Glucose, UA: NEGATIVE
KETONES UA: NEGATIVE
Leukocytes, UA: NEGATIVE
NITRITE UA: NEGATIVE
Protein, UA: NEGATIVE
RBC UA: NEGATIVE
SPEC GRAV UA: 1.02 (ref 1.005–1.030)
UUROB: 0.2 mg/dL (ref 0.2–1.0)
pH, UA: 6 (ref 5.0–7.5)

## 2018-07-18 LAB — MICROSCOPIC EXAMINATION
RBC, UA: NONE SEEN /hpf (ref 0–2)
WBC, UA: NONE SEEN /hpf (ref 0–5)

## 2018-07-24 ENCOUNTER — Encounter: Payer: Self-pay | Admitting: Urology

## 2018-08-03 ENCOUNTER — Encounter: Payer: Self-pay | Admitting: Internal Medicine

## 2018-08-03 NOTE — Telephone Encounter (Signed)
Please triage pt

## 2018-08-06 ENCOUNTER — Encounter: Payer: Self-pay | Admitting: Family Medicine

## 2018-08-06 ENCOUNTER — Ambulatory Visit (INDEPENDENT_AMBULATORY_CARE_PROVIDER_SITE_OTHER): Payer: 59 | Admitting: Family Medicine

## 2018-08-06 VITALS — BP 100/78 | HR 74 | Temp 98.8°F | Ht 62.0 in | Wt 142.6 lb

## 2018-08-06 DIAGNOSIS — N644 Mastodynia: Secondary | ICD-10-CM

## 2018-08-06 DIAGNOSIS — R0981 Nasal congestion: Secondary | ICD-10-CM | POA: Diagnosis not present

## 2018-08-06 MED ORDER — LORATADINE-PSEUDOEPHEDRINE ER 5-120 MG PO TB12: 1.0000 | ORAL_TABLET | Freq: Two times a day (BID) | ORAL | 1 refills | Status: DC

## 2018-08-06 NOTE — Progress Notes (Signed)
   Subjective:    Patient ID: Sarah West, female    DOB: 1986/09/26, 32 y.o.   MRN: [id number]  HPI   Presents to clinic with sinus pressure and pain for about a week. Does not use daily antihistamine. Denies fever or chills.   Patient also reports right breast pain for past month or so.  Describes the pain as intermittent, and sharp.  States this pain is not in relation to her menstrual cycle. Denies any lumps/masses in breast, no nipple discharge or nipple changes.    Patient Active Problem List   Diagnosis Date Noted  . Fatigue 05/28/2018  . GAD (generalized anxiety disorder) 05/28/2018  . Migraine 05/28/2018  . Allergic rhinitis 10/21/2016   Social History   Tobacco Use  . Smoking status: Current Every Day Smoker    Packs/day: 0.25    Types: Cigarettes  . Smokeless tobacco: Never Used  Substance Use Topics  . Alcohol use: Yes    Alcohol/week: 2.0 standard drinks    Types: 2 Glasses of wine per week   Review of Systems  Constitutional: Negative for chills, fatigue and fever.  HENT: +nasal congestion, ear pain, sinus pain.   Eyes: Negative.   Respiratory: Negative for cough, shortness of breath and wheezing.   Cardiovascular: Negative for chest pain, palpitations and leg swelling.  Breast: Right breast pain Gastrointestinal: Negative for abdominal pain, diarrhea, nausea and vomiting.  Genitourinary: Negative for dysuria, frequency and urgency.  Musculoskeletal: Negative for arthralgias and myalgias.  Skin: Negative for color change, pallor and rash.  Neurological: Negative for syncope, light-headedness and headaches.  Psychiatric/Behavioral: The patient is not nervous/anxious.       Objective:   Physical Exam  Constitutional: She is oriented to person, place, and time. She appears well-developed and well-nourished. No distress.  HENT:  Head: Normocephalic and atraumatic.  +post nasal drip  Eyes: Conjunctivae and EOM are normal. No scleral icterus.    Cardiovascular: Normal rate, regular rhythm and normal heart sounds.  Pulmonary/Chest: Effort normal and breath sounds normal. No respiratory distress. She has no wheezes. She has no rales. Right breast exhibits no inverted nipple, no mass, no nipple discharge, no skin change and no tenderness. Left breast exhibits no inverted nipple, no mass, no nipple discharge, no skin change and no tenderness.  No axillary lymphnodes palpated  Neurological: She is alert and oriented to person, place, and time.  Skin: Skin is warm and dry. No rash noted. No erythema. No pallor.  Psychiatric: She has a normal mood and affect. Her behavior is normal.  Nursing note and vitals reviewed.  Vitals:   08/06/18 1424  BP: 100/78  Pulse: 74  Temp: 98.8 F (37.1 C)  SpO2: 99%      Assessment & Plan:    Sinus congestion -- Take Claritin D twice daily for next week or so to treat congestion, then change to plain Claritin daily. Saline nasal washes can also help treat congestion  Breast pain, R -- Breast exam is normal. We will get Right breast US to further evaluate.    Follow up if symptoms persist or worsen. We will call with information about your US appt.

## 2018-08-06 NOTE — Patient Instructions (Signed)
Take Claritin D 2 times per day for next week to 2 weeks to help congestion, then can take plain Claritin daily

## 2018-08-07 ENCOUNTER — Other Ambulatory Visit: Payer: Self-pay

## 2018-08-17 ENCOUNTER — Encounter: Payer: Self-pay | Admitting: Internal Medicine

## 2018-08-17 DIAGNOSIS — J0110 Acute frontal sinusitis, unspecified: Secondary | ICD-10-CM

## 2018-08-17 DIAGNOSIS — B379 Candidiasis, unspecified: Secondary | ICD-10-CM

## 2018-08-17 MED ORDER — FLUCONAZOLE 150 MG PO TABS
ORAL_TABLET | ORAL | 0 refills | Status: DC
Start: 2018-08-17 — End: 2018-10-25

## 2018-08-17 MED ORDER — AMOXICILLIN-POT CLAVULANATE 875-125 MG PO TABS: 1.0000 | ORAL_TABLET | Freq: Two times a day (BID) | ORAL | 0 refills | Status: DC

## 2018-08-17 NOTE — Telephone Encounter (Signed)
Augmentin Rx sent to pharmacy. It is a good idea to take probiotics and or eat yogurt daily with antibiotics.

## 2018-08-17 NOTE — Telephone Encounter (Signed)
Diflucan sent also

## 2018-10-24 ENCOUNTER — Ambulatory Visit: Payer: Self-pay | Admitting: *Deleted

## 2018-10-24 NOTE — Telephone Encounter (Signed)
Pt reports tingling of both hands and both feet since May  "When I had a seizure." States was intermittent, gradually increasing in duration; today it has been  constant. Denies any numbness, discoloration. "Hands are always cold."  Also reports lightheadedness since May, positional, worse with turning head, going from sitting to standing.  No 'spinning.'  Denies headache, CP;  reports SOB at times "But from my anxiety."  States is staying hydrated, appetite fair.  Appt made for tomorrow with Dr. Shirlee Latch- [facility]. Care advise given per protocol.  Reason for Disposition . [1] Numbness or tingling in one or both hands AND [2] is a chronic symptom (recurrent or ongoing AND present > 4 weeks)  Answer Assessment - Initial Assessment Questions 1. SYMPTOM: "What is the main symptom you are concerned about?" (e.g., weakness, numbness)     Tingling both feet and both hands 2. ONSET: "When did this start?" (minutes, hours, days; while sleeping)     In May but worse x 1 month 3. LAST NORMAL: "When was the last time you were normal (no symptoms)?"     may 4. PATTERN "Does this come and go, or has it been constant since it started?"  "Is it present now?"     Constant all day today 5. CARDIAC SYMPTOMS: "Have you had any of the following symptoms: chest pain, difficulty breathing, palpitations?"     anxiety 6. NEUROLOGIC SYMPTOMS: "Have you had any of the following symptoms: headache, dizziness, vision loss, double vision, changes in speech, unsteady on your feet?"     Dizziness, lightheaded, onset since May, positional, turning head, sitting to standing. 7. OTHER SYMPTOMS: "Do you have any other symptoms?"     no 8. PREGNANCY: "Is there any chance you are pregnant?" "When was your last menstrual period?"     no  Protocols used: NEUROLOGIC DEFICIT-A-AH

## 2018-10-25 ENCOUNTER — Ambulatory Visit (INDEPENDENT_AMBULATORY_CARE_PROVIDER_SITE_OTHER): Payer: 59 | Admitting: Internal Medicine

## 2018-10-25 ENCOUNTER — Encounter: Payer: Self-pay | Admitting: Internal Medicine

## 2018-10-25 VITALS — BP 102/68 | HR 100 | Temp 98.6°F | Ht 62.0 in | Wt 145.8 lb

## 2018-10-25 DIAGNOSIS — E559 Vitamin D deficiency, unspecified: Secondary | ICD-10-CM | POA: Diagnosis not present

## 2018-10-25 DIAGNOSIS — F411 Generalized anxiety disorder: Secondary | ICD-10-CM | POA: Diagnosis not present

## 2018-10-25 DIAGNOSIS — R202 Paresthesia of skin: Secondary | ICD-10-CM

## 2018-10-25 DIAGNOSIS — R42 Dizziness and giddiness: Secondary | ICD-10-CM | POA: Diagnosis not present

## 2018-10-25 DIAGNOSIS — E538 Deficiency of other specified B group vitamins: Secondary | ICD-10-CM | POA: Diagnosis not present

## 2018-10-25 HISTORY — DX: Dizziness and giddiness: R42

## 2018-10-25 HISTORY — DX: Paresthesia of skin: R20.2

## 2018-10-25 LAB — FOLATE: Folate: 15.8 ng/mL (ref >5.9)

## 2018-10-25 LAB — VITAMIN B12: VITAMIN B 12: 214 pg/mL (ref 211–911)

## 2018-10-25 MED ORDER — ALPRAZOLAM 0.25 MG PO TABS: 0.2500 mg | ORAL_TABLET | Freq: Every day | ORAL | 0 refills | Status: DC | PRN

## 2018-10-25 NOTE — Progress Notes (Signed)
Chief Complaint  Patient presents with  . Follow-up   F/u  1. C/o tingling in b/l hands and feet. Reviewed EEG neurology normal but rec prolonged EEG MRI brain negative 05/2018. Reviewed labs 05/2018 no reason I.e thyroid, hypoglycemia for tingling  2. C/o dizziness w/o veritgo sx's no nausea  3. C/o anxiety monthly esp around cycle tried klonopin in the pasted caused GI upset and tried xanax 2 mg daily in the past but declines to try this high dose in future. She does not want to try SSRI c/w taking antidepressant. She tries deep breathing sometimes this is not enough  Review of Systems  Constitutional: Negative for weight loss.  HENT: Negative for hearing loss.   Eyes: Negative for blurred vision.  Respiratory: Negative for shortness of breath.   Cardiovascular: Negative for chest pain.  Skin: Negative for rash.  Neurological: Positive for dizziness and sensory change.  Psychiatric/Behavioral: The patient is nervous/anxious.    Past Medical History:  Diagnosis Date  . Anxiety   . Asthma   . History of cervical dysplasia    s/p cryotherapy 8 years ago  . History of genital warts   . Hx of migraines    teenager   . Seizure (HCC)    grand mal  . UTI (urinary tract infection)    Past Surgical History:  Procedure Laterality Date  . TONSILLECTOMY     2009  . WISDOM TOOTH EXTRACTION     Family History  Problem Relation Age of Onset  . Asthma Mother   . Breast cancer Maternal Grandmother   . Cancer Maternal Grandmother        breast  . Stroke Maternal Grandmother   . Heart disease Paternal Grandfather   . Cancer Maternal Aunt   . Cervical cancer Other    Social History   Socioeconomic History  . Marital status: Married    Spouse name: Not on file  . Number of children: Not on file  . Years of education: Not on file  . Highest education level: Not on file  Occupational History  . Not on file  Social Needs  . Financial resource strain: Not on file  . Food  insecurity:    Worry: Not on file    Inability: Not on file  . Transportation needs:    Medical: Not on file    Non-medical: Not on file  Tobacco Use  . Smoking status: Current Every Day Smoker    Packs/day: 0.25    Types: Cigarettes  . Smokeless tobacco: Never Used  Substance and Sexual Activity  . Alcohol use: Yes    Alcohol/week: 2.0 standard drinks    Types: 2 Glasses of wine per week  . Drug use: No  . Sexual activity: Yes    Birth control/protection: None  Lifestyle  . Physical activity:    Days per week: Not on file    Minutes per session: Not on file  . Stress: Not on file  Relationships  . Social connections:    Talks on phone: Not on file    Gets together: Not on file    Attends religious service: Not on file    Active member of club or organization: Not on file    Attends meetings of clubs or organizations: Not on file    Relationship status: Not on file  . Intimate partner violence:    Fear of current or ex partner: Not on file    Emotionally abused: Not on file  Physically abused: Not on file    Forced sexual activity: Not on file  Other Topics Concern  . Not on file  Social History Narrative   Married since 2010    No kids    Works at [employer]    No guns    Wears seat belt    Safe in relationship    Current Meds  Medication Sig  . meclizine (ANTIVERT) 25 MG tablet Take 25 mg by mouth 3 (three) times daily.   No Known Allergies No results found for this or any previous visit (from the past 2160 hour(s)). Objective  Body mass index is 26.67 kg/m. Wt Readings from Last 3 Encounters:  10/25/18 145 lb 12.8 oz (66.1 kg)  08/06/18 142 lb 9.6 oz (64.7 kg)  07/17/18 141 lb 12.8 oz (64.3 kg)   Temp Readings from Last 3 Encounters:  10/25/18 98.6 F (37 C) (Oral)  08/06/18 98.8 F (37.1 C) (Oral)  05/28/18 98.5 F (36.9 C) (Oral)   BP Readings from Last 3 Encounters:  10/25/18 102/68  08/06/18 100/78  07/17/18 107/72   Pulse Readings  from Last 3 Encounters:  10/25/18 100  08/06/18 74  07/17/18 73    Physical Exam  Constitutional: She is oriented to person, place, and time. Vital signs are normal. She appears well-developed and well-nourished. She is cooperative.  HENT:  Head: Normocephalic and atraumatic.  Mouth/Throat: Oropharynx is clear and moist and mucous membranes are normal.  Eyes: Pupils are equal, round, and reactive to light. Conjunctivae are normal.  Cardiovascular: Normal rate, regular rhythm and normal heart sounds.  Pulmonary/Chest: Effort normal and breath sounds normal.  Neurological: She is alert and oriented to person, place, and time. Gait normal.  +tinels b/l wrists in thumb area   Skin: Skin is warm, dry and intact.  Psychiatric: She has a normal mood and affect. Her speech is normal and behavior is normal. Judgment and thought content normal. Cognition and memory are normal.  Nursing note and vitals reviewed.   Assessment   1. GAD  2. Dizziness and tingling arms and legs. MRI neg 05/2018  3. HM Plan   1. Prn xanax 0.25  2. Refer back to [facility] neurology Dr. Karel Jarvis for NCS and further w/u and consider prolonged EEG  Check B12 and folic acid today  3.  Had flu shot fall 2018 and 2019 with [facility] Tdap had 08/19/16 Declines STD testing  Pap 07/04/17 neg neg HPV  rec smoking cessation 4-5 cig/qd on and off since age 4 y.o  rec take D3 2000 IU qd otc  Provider: Dr. French Ana McLean-Scocuzza-Internal Medicine

## 2018-10-25 NOTE — Patient Instructions (Addendum)
D3 2000 IU daily otc    Vitamin D Deficiency Vitamin D deficiency is when your body does not have enough vitamin D. Vitamin D is important to your body for many reasons:  It helps the body to absorb two important minerals, called calcium and phosphorus.  It plays a role in bone health.  It may help to prevent some diseases, such as diabetes and multiple sclerosis.  It plays a role in muscle function, including heart function.  You can get vitamin D by:  Eating foods that naturally contain vitamin D.  Eating or drinking milk or other dairy products that have vitamin D added to them.  Taking a vitamin D supplement or a multivitamin supplement that contains vitamin D.  Being in the sun. Your body naturally makes vitamin D when your skin is exposed to sunlight. Your body changes the sunlight into a form of the vitamin that the body can use.  If vitamin D deficiency is severe, it can cause a condition in which your bones become soft. In adults, this condition is called osteomalacia. In children, this condition is called rickets. What are the causes? Vitamin D deficiency may be caused by:  Not eating enough foods that contain vitamin D.  Not getting enough sun exposure.  Having certain digestive system diseases that make it difficult for your body to absorb vitamin D. These diseases include Crohn disease, chronic pancreatitis, and cystic fibrosis.  Having a surgery in which a part of the stomach or a part of the small intestine is removed.  Being obese.  Having chronic kidney disease or liver disease.  What increases the risk? This condition is more likely to develop in:  Older people.  People who do not spend much time outdoors.  People who live in a long-term care facility.  People who have had broken bones.  People with weak or thin bones (osteoporosis).  People who have a disease or condition that changes how the body absorbs vitamin D.  People who have dark  skin.  People who take certain medicines, such as steroid medicines or certain seizure medicines.  People who are overweight or obese.  What are the signs or symptoms? In mild cases of vitamin D deficiency, there may not be any symptoms. If the condition is severe, symptoms may include:  Bone pain.  Muscle pain.  Falling often.  Broken bones caused by a minor injury.  How is this diagnosed? This condition is usually diagnosed with a blood test. How is this treated? Treatment for this condition may depend on what caused the condition. Treatment options include:  Taking vitamin D supplements.  Taking a calcium supplement. Your health care provider will suggest what dose is best for you.  Follow these instructions at home:  Take medicines and supplements only as told by your health care provider.  Eat foods that contain vitamin D. Choices include: ? Fortified dairy products, cereals, or juices. Fortified means that vitamin D has been added to the food. Check the label on the package to be sure. ? Fatty fish, such as salmon or trout. ? Eggs. ? Oysters.  Do not use a tanning bed.  Maintain a healthy weight. Lose weight, if needed.  Keep all follow-up visits as told by your health care provider. This is important. Contact a health care provider if:  Your symptoms do not go away.  You feel like throwing up (nausea) or you throw up (vomit).  You have fewer bowel movements than usual or  it is difficult for you to have a bowel movement (constipation). This information is not intended to replace advice given to you by your health care provider. Make sure you discuss any questions you have with your health care provider. Document Released: 02/27/2012 Document Revised: 05/18/2016 Document Reviewed: 04/22/2015 Elsevier Interactive Patient Education  2018 [employer].  Dizziness Dizziness is a common problem. It is a feeling of unsteadiness or light-headedness. You may feel  like you are about to faint. Dizziness can lead to injury if you stumble or fall. Anyone can become dizzy, but dizziness is more common in older adults. This condition can be caused by a number of things, including medicines, dehydration, or illness. Follow these instructions at home: Eating and drinking  Drink enough fluid to keep your urine clear or pale yellow. This helps to keep you from becoming dehydrated. Try to drink more clear fluids, such as water.  Do not drink alcohol.  Limit your caffeine intake if told to do so by your health care provider. Check ingredients and nutrition facts to see if a food or beverage contains caffeine.  Limit your salt (sodium) intake if told to do so by your health care provider. Check ingredients and nutrition facts to see if a food or beverage contains sodium. Activity  Avoid making quick movements. ? Rise slowly from chairs and steady yourself until you feel okay. ? In the morning, first sit up on the side of the bed. When you feel okay, stand slowly while you hold onto something until you know that your balance is fine.  If you need to stand in one place for a long time, move your legs often. Tighten and relax the muscles in your legs while you are standing.  Do not drive or use heavy machinery if you feel dizzy.  Avoid bending down if you feel dizzy. Place items in your home so that they are easy for you to reach without leaning over. Lifestyle  Do not use any products that contain nicotine or tobacco, such as cigarettes and e-cigarettes. If you need help quitting, ask your health care provider.  Try to reduce your stress level by using methods such as yoga or meditation. Talk with your health care provider if you need help to manage your stress. General instructions  Watch your dizziness for any changes.  Take over-the-counter and prescription medicines only as told by your health care provider. Talk with your health care provider if you  think that your dizziness is caused by a medicine that you are taking.  Tell a friend or a family member that you are feeling dizzy. If he or she notices any changes in your behavior, have this person call your health care provider.  Keep all follow-up visits as told by your health care provider. This is important. Contact a health care provider if:  Your dizziness does not go away.  Your dizziness or light-headedness gets worse.  You feel nauseous.  You have reduced hearing.  You have new symptoms.  You are unsteady on your feet or you feel like the room is spinning. Get help right away if:  You vomit or have diarrhea and are unable to eat or drink anything.  You have problems talking, walking, swallowing, or using your arms, hands, or legs.  You feel generally weak.  You are not thinking clearly or you have trouble forming sentences. It may take a friend or family member to notice this.  You have chest pain, abdominal pain,  shortness of breath, or sweating.  Your vision changes.  You have any bleeding.  You have a severe headache.  You have neck pain or a stiff neck.  You have a fever. These symptoms may represent a serious problem that is an emergency. Do not wait to see if the symptoms will go away. Get medical help right away. Call your local emergency services (911 in the [place] Do not drive yourself to the hospital. Summary  Dizziness is a feeling of unsteadiness or light-headedness. This condition can be caused by a number of things, including medicines, dehydration, or illness.  Anyone can become dizzy, but dizziness is more common in older adults.  Drink enough fluid to keep your urine clear or pale yellow. Do not drink alcohol.  Avoid making quick movements if you feel dizzy. Monitor your dizziness for any changes. This information is not intended to replace advice given to you by your health care provider. Make sure you discuss any questions you have with  your health care provider. Document Released: 05/31/2001 Document Revised: 01/07/2017 Document Reviewed: 01/07/2017 Elsevier Interactive Patient Education  2018 [place] After being diagnosed with an anxiety disorder, you may be relieved to know why you have felt or behaved a certain way. It is natural to also feel overwhelmed about the treatment ahead and what it will mean for your life. With care and support, you can manage this condition and recover from it. How to cope with anxiety Dealing with stress Stress is your body's reaction to life changes and events, both good and bad. Stress can last just a few hours or it can be ongoing. Stress can play a major role in anxiety, so it is important to learn both how to cope with stress and how to think about it differently. Talk with your health care provider or a counselor to learn more about stress reduction. He or she may suggest some stress reduction techniques, such as:  Music therapy. This can include creating or listening to music that you enjoy and that inspires you.  Mindfulness-based meditation. This involves being aware of your normal breaths, rather than trying to control your breathing. It can be done while sitting or walking.  Centering prayer. This is a kind of meditation that involves focusing on a word, phrase, or sacred image that is meaningful to you and that brings you peace.  Deep breathing. To do this, expand your stomach and inhale slowly through your nose. Hold your breath for 3-5 seconds. Then exhale slowly, allowing your stomach muscles to relax.  Self-talk. This is a skill where you identify thought patterns that lead to anxiety reactions and correct those thoughts.  Muscle relaxation. This involves tensing muscles then relaxing them.  Choose a stress reduction technique that fits your lifestyle and personality. Stress reduction techniques take time and practice. Set aside 5-15 minutes a day to  do them. Therapists can offer training in these techniques. The training may be covered by some insurance plans. Other things you can do to manage stress include:  Keeping a stress diary. This can help you learn what triggers your stress and ways to control your response.  Thinking about how you respond to certain situations. You may not be able to control everything, but you can control your reaction.  Making time for activities that help you relax, and not feeling guilty about spending your time in this way.  Therapy combined with coping and stress-reduction skills provides the best chance  for successful treatment. Medicines Medicines can help ease symptoms. Medicines for anxiety include:  Anti-anxiety drugs.  Antidepressants.  Beta-blockers.  Medicines may be used as the main treatment for anxiety disorder, along with therapy, or if other treatments are not working. Medicines should be prescribed by a health care provider. Relationships Relationships can play a big part in helping you recover. Try to spend more time connecting with trusted friends and family members. Consider going to couples counseling, taking family education classes, or going to family therapy. Therapy can help you and others better understand the condition. How to recognize changes in your condition Everyone has a different response to treatment for anxiety. Recovery from anxiety happens when symptoms decrease and stop interfering with your daily activities at home or work. This may mean that you will start to:  Have better concentration and focus.  Sleep better.  Be less irritable.  Have more energy.  Have improved memory.  It is important to recognize when your condition is getting worse. Contact your health care provider if your symptoms interfere with home or work and you do not feel like your condition is improving. Where to find help and support: You can get help and support from these  sources:  Self-help groups.  Online and [employer].  A trusted spiritual leader.  Couples counseling.  Family education classes.  Family therapy.  Follow these instructions at home:  Eat a healthy diet that includes plenty of vegetables, fruits, whole grains, low-fat dairy products, and lean protein. Do not eat a lot of foods that are high in solid fats, added sugars, or salt.  Exercise. Most adults should do the following: ? Exercise for at least 150 minutes each week. The exercise should increase your heart rate and make you sweat (moderate-intensity exercise). ? Strengthening exercises at least twice a week.  Cut down on caffeine, tobacco, alcohol, and other potentially harmful substances.  Get the right amount and quality of sleep. Most adults need 7-9 hours of sleep each night.  Make choices that simplify your life.  Take over-the-counter and prescription medicines only as told by your health care provider.  Avoid caffeine, alcohol, and certain over-the-counter cold medicines. These may make you feel worse. Ask your pharmacist which medicines to avoid.  Keep all follow-up visits as told by your health care provider. This is important. Questions to ask your health care provider  Would I benefit from therapy?  How often should I follow up with a health care provider?  How long do I need to take medicine?  Are there any long-term side effects of my medicine?  Are there any alternatives to taking medicine? Contact a health care provider if:  You have a hard time staying focused or finishing daily tasks.  You spend many hours a day feeling worried about everyday life.  You become exhausted by worry.  You start to have headaches, feel tense, or have nausea.  You urinate more than normal.  You have diarrhea. Get help right away if:  You have a racing heart and shortness of breath.  You have thoughts of hurting yourself or others. If you ever  feel like you may hurt yourself or others, or have thoughts about taking your own life, get help right away. You can go to your nearest emergency department or call:  Your local emergency services (911 in the [place]  A suicide crisis helpline, such as the [facility] at (717) 261-4956. This is open 24-hours a day.  Summary  Taking steps to deal with stress can help calm you.  Medicines cannot cure anxiety disorders, but they can help ease symptoms.  Family, friends, and partners can play a big part in helping you recover from an anxiety disorder. This information is not intended to replace advice given to you by your health care provider. Make sure you discuss any questions you have with your health care provider. Document Released: 11/29/2016 Document Revised: 11/29/2016 Document Reviewed: 11/29/2016 Elsevier Interactive Patient Education  2018 [employer].    Hypoglycemia Hypoglycemia occurs when the level of sugar (glucose) in the blood is too low. Glucose is a type of sugar that provides the body's main source of energy. Certain hormones (insulin and glucagon) control the level of glucose in the blood. Insulin lowers blood glucose, and glucagon increases blood glucose. Hypoglycemia can result from having too much insulin in the bloodstream, or from not eating enough food that contains glucose. Hypoglycemia can happen in people who do or do not have diabetes. It can develop quickly, and it can be a medical emergency. What are the causes? Hypoglycemia occurs most often in people who have diabetes. If you have diabetes, hypoglycemia may be caused by:  Diabetes medicine.  Not eating enough, or not eating often enough.  Increased physical activity.  Drinking alcohol, especially when you have not eaten recently.  If you do not have diabetes, hypoglycemia may be caused by:  A tumor in the pancreas. The pancreas is the organ that makes insulin.  Not eating  enough, or not eating for long periods at a time (fasting).  Severe infection or illness that affects the liver, heart, or kidneys.  Certain medicines.  You may also have reactive hypoglycemia. This condition causes hypoglycemia within 4 hours of eating a meal. This may occur after having stomach surgery. Sometimes, the cause of reactive hypoglycemia is not known. What increases the risk? Hypoglycemia is more likely to develop in:  People who have diabetes and take medicines to lower blood glucose.  People who abuse alcohol.  People who have a severe illness.  What are the signs or symptoms? Hypoglycemia may not cause any symptoms. If you have symptoms, they may include:  Hunger.  Anxiety.  Sweating and feeling clammy.  Confusion.  Dizziness or feeling light-headed.  Sleepiness.  Nausea.  Increased heart rate.  Headache.  Blurry vision.  Seizure.  Nightmares.  Tingling or numbness around the mouth, lips, or tongue.  A change in speech.  Decreased ability to concentrate.  A change in coordination.  Restless sleep.  Tremors or shakes.  Fainting.  Irritability.  How is this diagnosed? Hypoglycemia is diagnosed with a blood test to measure your blood glucose level. This blood test is done while you are having symptoms. Your health care provider may also do a physical exam and review your medical history. If you do not have diabetes, other tests may be done to find the cause of your hypoglycemia. How is this treated? This condition can often be treated by immediately eating or drinking something that contains glucose, such as:  3-4 sugar tablets (glucose pills).  Glucose gel, 15-gram tube.  Fruit juice, 4 oz (120 mL).  Regular soda (not diet soda), 4 oz (120 mL).  Low-fat milk, 4 oz (120 mL).  Several pieces of hard candy.  Sugar or honey, 1 Tbsp.  Treating Hypoglycemia If You Have Diabetes  If you are alert and able to swallow safely,  follow the 15:15 rule:  Take  15 grams of a rapid-acting carbohydrate. Rapid-acting options include: ? 1 tube of glucose gel. ? 3 glucose pills. ? 6-8 pieces of hard candy. ? 4 oz (120 mL) of fruit juice. ? 4 oz (120 ml) of regular (not diet) soda.  Check your blood glucose 15 minutes after you take the carbohydrate.  If the repeat blood glucose level is still at or below 70 mg/dL (3.9 mmol/L), take 15 grams of a carbohydrate again.  If your blood glucose level does not increase above 70 mg/dL (3.9 mmol/L) after 3 tries, seek emergency medical care.  After your blood glucose level returns to normal, eat a meal or a snack within 1 hour.  Treating Severe Hypoglycemia Severe hypoglycemia is when your blood glucose level is at or below 54 mg/dL (3 mmol/L). Severe hypoglycemia is an emergency. Do not wait to see if the symptoms will go away. Get medical help right away. Call your local emergency services (911 in the [place] Do not drive yourself to the hospital. If you have severe hypoglycemia and you cannot eat or drink, you may need an injection of glucagon. A family member or close friend should learn how to check your blood glucose and how to give you a glucagon injection. Ask your health care provider if you need to have an emergency glucagon injection kit available. Severe hypoglycemia may need to be treated in a hospital. The treatment may include getting glucose through an IV tube. You may also need treatment for the cause of your hypoglycemia. Follow these instructions at home: General instructions  Avoid any diets that cause you to not eat enough food. Talk with your health care provider before you start any new diet.  Take over-the-counter and prescription medicines only as told by your health care provider.  Limit alcohol intake to no more than 1 drink per day for nonpregnant women and 2 drinks per day for men. One drink equals 12 oz of beer, 5 oz of wine, or 1 oz of hard  liquor.  Keep all follow-up visits as told by your health care provider. This is important. If You Have Diabetes:   Make sure you know the symptoms of hypoglycemia.  Always have a rapid-acting carbohydrate snack with you to treat low blood sugar.  Follow your diabetes management plan, as told by your health care provider. Make sure you: ? Take your medicines as directed. ? Follow your exercise plan. ? Follow your meal plan. Eat on time, and do not skip meals. ? Check your blood glucose as often as directed. Make sure to check your blood glucose before and after exercise. If you exercise longer or in a different way than usual, check your blood glucose more often. ? Follow your sick day plan whenever you cannot eat or drink normally. Make this plan in advance with your health care provider.  Share your diabetes management plan with people in your workplace, school, and household.  Check your urine for ketones when you are ill and as told by your health care provider.  Carry a medical alert card or wear medical alert jewelry. If You Have Reactive Hypoglycemia or Low Blood Sugar From Other Causes:  Monitor your blood glucose as told by your health care provider.  Follow instructions from your health care provider about eating or drinking restrictions. Contact a health care provider if:  You have problems keeping your blood glucose in your target range.  You have frequent episodes of hypoglycemia. Get help right away if:  You continue to have hypoglycemia symptoms after eating or drinking something containing glucose.  Your blood glucose is at or below 54 mg/dL (3 mmol/L).  You have a seizure.  You faint. These symptoms may represent a serious problem that is an emergency. Do not wait to see if the symptoms will go away. Get medical help right away. Call your local emergency services (911 in the [place] Do not drive yourself to the hospital. This information is not intended to  replace advice given to you by your health care provider. Make sure you discuss any questions you have with your health care provider. Document Released: 12/05/2005 Document Revised: 05/18/2016 Document Reviewed: 01/08/2016 Elsevier Interactive Patient Education  [employer].

## 2018-10-25 NOTE — Telephone Encounter (Signed)
Please see notes. Pt scheduled for appt today.

## 2018-10-25 NOTE — Progress Notes (Signed)
Pre visit review using our clinic review tool, if applicable. No additional management support is needed unless otherwise documented below in the visit note. 

## 2018-10-25 NOTE — Telephone Encounter (Signed)
FYI

## 2018-12-25 IMAGING — MR MR HEAD WO/W CM
11 of 13 series · 38 of 48 positions shown · IV contrast (multihance)
Comparison: Head CT 07/09/2016

CLINICAL DATA: New onset seizure.  New onset headache

EXAM:
MRI HEAD WITHOUT AND WITH CONTRAST
TECHNIQUE: Multiplanar, multiecho pulse sequences of the brain and surrounding
structures were obtained without and with intravenous contrast.
CONTRAST:  15mL MULTIHANCE GADOBENATE DIMEGLUMINE 529 MG/ML IV SOLN

[Series 5: ax dwi_tracew · axial · 3.0mm · 1.50mm/px · z∈[-112,+31]mm · 7 of 84 slices shown]
[im 1/84]
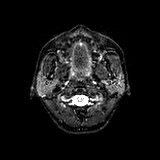
[im 14/84]
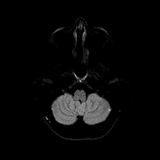
[im 28/84]
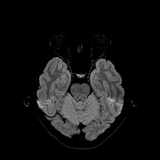
[im 42/84]
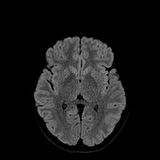
[im 56/84]
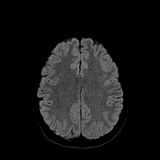
[im 70/84]
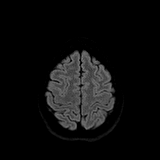
[im 84/84]
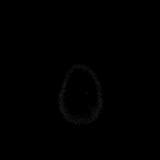

[Series 6: ax dwi_adc · axial · 3.0mm · 1.50mm/px · z∈[-112,+31]mm · 3 of 42 slices shown]
[im 1/42]
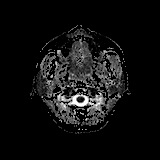
[im 21/42]
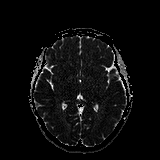
[im 42/42]
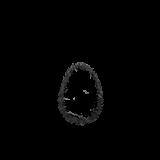

[Series 7: cor dwi_tracew · coronal · 5.0mm · 1.44mm/px · 6 of 70 slices shown]
[im 1/70]
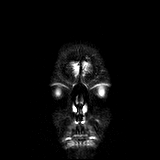
[im 14/70]
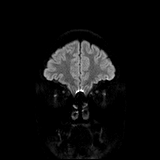
[im 28/70]
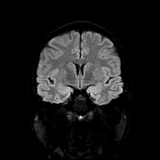
[im 42/70]
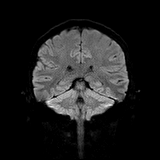
[im 56/70]
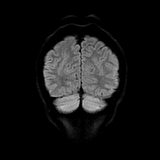
[im 70/70]
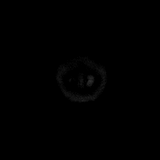

[Series 8: cor dwi_adc · coronal · 5.0mm · 1.44mm/px · 3 of 35 slices shown]
[im 1/35]
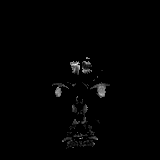
[im 18/35]
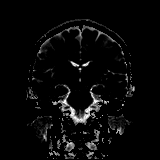
[im 35/35]
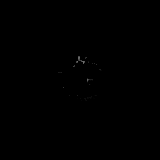

[Series 9: T1 · sagittal · 5.0mm · 0.75mm/px · 2 of 24 slices shown]
[im 1/24]
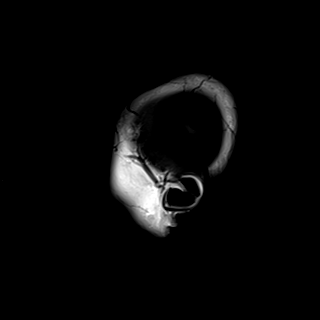
[im 24/24]
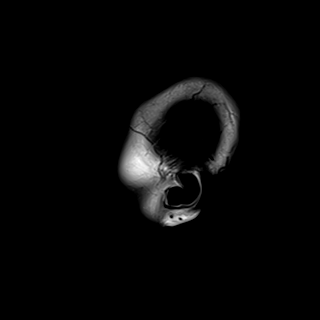

[Series 10: T2 · axial · 5.0mm · 0.72mm/px · z∈[-111,+28]mm · 2 of 25 slices shown]
[im 1/25]
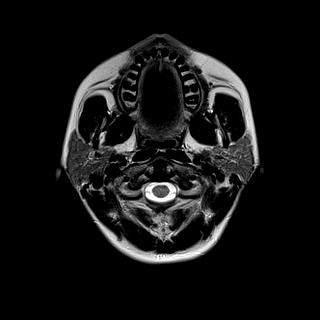
[im 25/25]
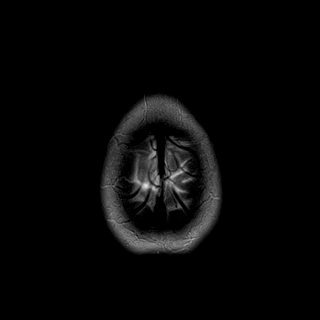

[Series 11: FLAIR · axial · 5.0mm · 0.45mm/px · z∈[-110,+28]mm · 2 of 25 slices shown]
[im 1/25]
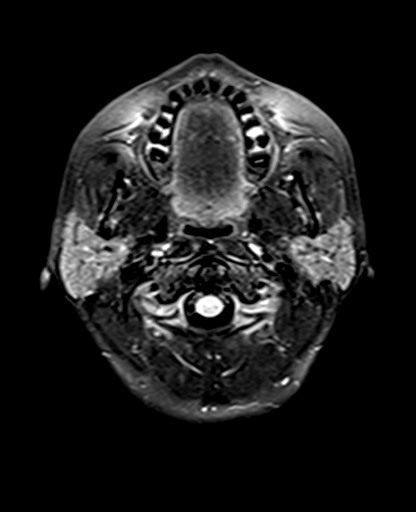
[im 25/25]
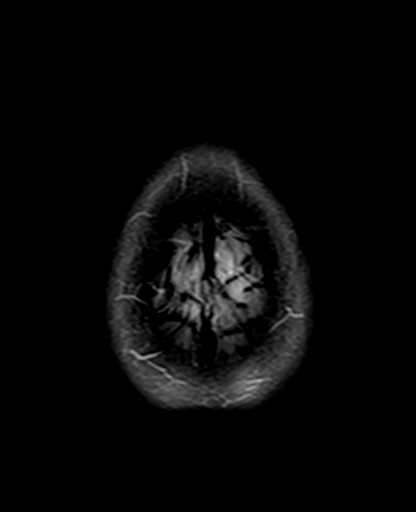

[Series 12: swi_images · axial · 3.0mm · 0.90mm/px · z∈[-120,+51]mm · 5 of 60 slices shown]
[im 1/60]
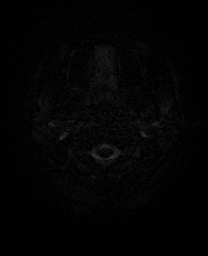
[im 15/60]
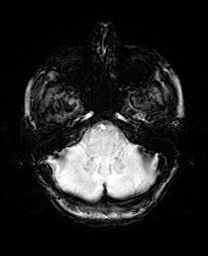
[im 30/60]
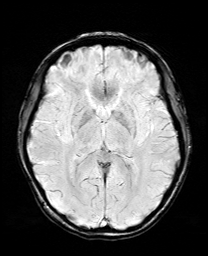
[im 45/60]
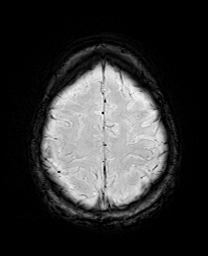
[im 60/60]
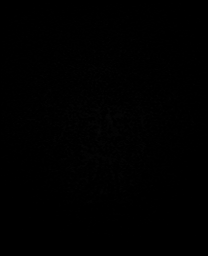

[Series 13: mip_images(sw) · axial · 24.0mm · 0.90mm/px · z∈[-110,+40]mm · 4 of 53 slices shown]
[im 1/53]
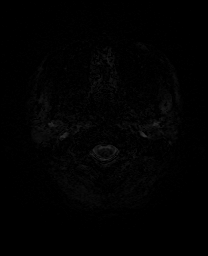
[im 18/53]
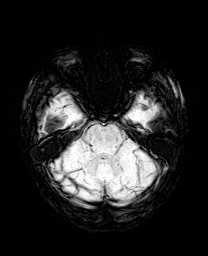
[im 35/53]
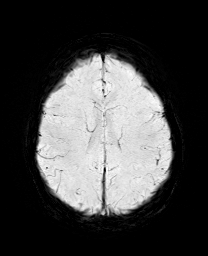
[im 53/53]
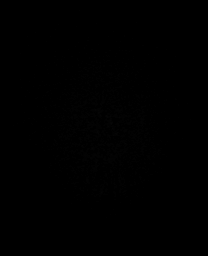

[Series 16: T2 post-contrast · coronal · 5.0mm · 0.72mm/px · 2 of 28 slices shown]
[im 1/28]
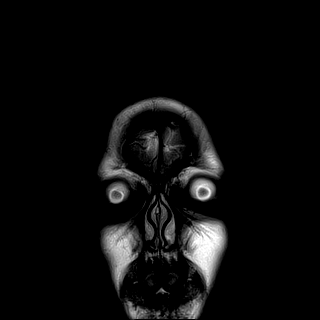
[im 28/28]
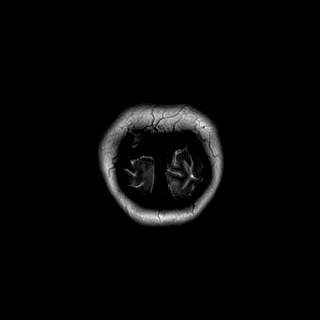

[Series 18: T1 post-contrast · coronal · 5.0mm · 0.34mm/px · 2 of 28 slices shown]
[im 1/28]
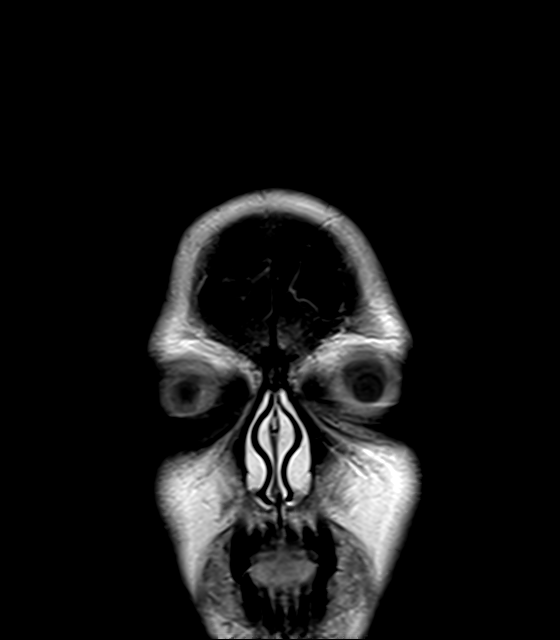
[im 28/28]
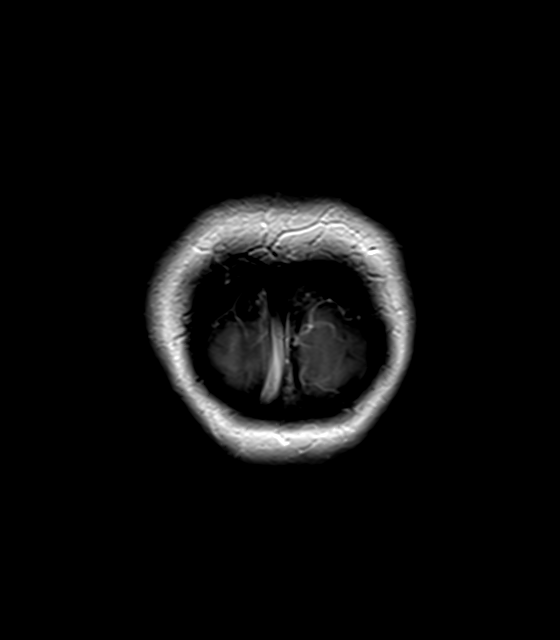

[38 of 48 positions shown; findings below may reference images not displayed]

FINDINGS: Brain: No explanation for the history. Normal brain morphology,
volume, and signal. No infarct, hemorrhage, hydrocephalus, or mass.
Coronal thin-section T2 weighted imaging was not acquired. On
standard coronal T2 imaging the hippocampi and fornices have a
symmetric normal appearance.

Vascular: Major flow voids and vascular enhancements are preserved

Skull and upper cervical spine: No focal marrow lesion.

Sinuses/Orbits: Negative
IMPRESSION: Normal brain MRI

## 2019-02-26 ENCOUNTER — Ambulatory Visit: Payer: Self-pay | Admitting: Internal Medicine

## 2019-03-07 ENCOUNTER — Ambulatory Visit: Payer: Self-pay | Admitting: Internal Medicine

## 2019-07-18 ENCOUNTER — Encounter: Payer: Self-pay | Admitting: Internal Medicine

## 2019-07-18 ENCOUNTER — Other Ambulatory Visit: Payer: Self-pay

## 2019-07-18 ENCOUNTER — Telehealth (INDEPENDENT_AMBULATORY_CARE_PROVIDER_SITE_OTHER): Payer: 59 | Admitting: Internal Medicine

## 2019-07-18 VITALS — Ht 62.0 in | Wt 151.6 lb

## 2019-07-18 DIAGNOSIS — F411 Generalized anxiety disorder: Secondary | ICD-10-CM

## 2019-07-18 DIAGNOSIS — Z0000 Encounter for general adult medical examination without abnormal findings: Secondary | ICD-10-CM | POA: Diagnosis not present

## 2019-07-18 DIAGNOSIS — Z13220 Encounter for screening for lipoid disorders: Secondary | ICD-10-CM

## 2019-07-18 DIAGNOSIS — G4700 Insomnia, unspecified: Secondary | ICD-10-CM | POA: Insufficient documentation

## 2019-07-18 DIAGNOSIS — E559 Vitamin D deficiency, unspecified: Secondary | ICD-10-CM

## 2019-07-18 DIAGNOSIS — Z1389 Encounter for screening for other disorder: Secondary | ICD-10-CM | POA: Diagnosis not present

## 2019-07-18 HISTORY — DX: Encounter for general adult medical examination without abnormal findings: Z00.00

## 2019-07-18 MED ORDER — TRAZODONE HCL 50 MG PO TABS: 25.0000 mg | ORAL_TABLET | Freq: Every evening | ORAL | 0 refills | Status: DC | PRN

## 2019-07-18 MED ORDER — ALPRAZOLAM 0.25 MG PO TABS: 0.2500 mg | ORAL_TABLET | Freq: Every day | ORAL | 0 refills | Status: DC | PRN

## 2019-07-18 NOTE — Progress Notes (Signed)
Telephone Note  I connected with Sarah West   on 07/18/19 at  8:05 AM EDT by a telephone and verified that I am speaking with the correct person using two identifiers.  Location patient: work Environmental manager or home office Persons participating in the virtual visit: patient, provider  I discussed the limitations of evaluation and management by telemedicine and the availability of in person appointments. The patient expressed understanding and agreed to proceed.   HPI: 1. Anxiety/depression anxiety ok except around menses increased anxiety xanax prn helps and she uses sparingly. She c/o reduced and interrupted sleep over 8 hrs no help melatonin, hydroxyzine in the past  2. Annual    ROS: See pertinent positives and negatives per HPI. General: wt stable Heent: no sore throat  Cv: no chest pain  Lungs: no sob  Gi: no ab pain  Gu:no issues  MSK: no joint pain  Psych: +anxiety, +insomnia  Skin: no issues  Neuro: no h/a   Past Medical History:  Diagnosis Date  . Anxiety   . Asthma   . History of cervical dysplasia    s/p cryotherapy 8 years ago  . History of genital warts   . Hx of migraines    teenager   . Seizure ([place])    grand mal  . UTI (urinary tract infection)     Past Surgical History:  Procedure Laterality Date  . TONSILLECTOMY     2009  . WISDOM TOOTH EXTRACTION      Family History  Problem Relation Age of Onset  . Asthma Mother   . Breast cancer Maternal Grandmother   . Cancer Maternal Grandmother        breast  . Stroke Maternal Grandmother   . Heart disease Paternal Grandfather   . Cancer Maternal Aunt   . Cervical cancer Other     SOCIAL HX:  Married since 2010  No kids  Works at [employer]  No guns  Wears seat belt  Safe in relationship    Current Outpatient Medications:  .  ALPRAZolam (XANAX) 0.25 MG tablet, Take 1 tablet (0.25 mg total) by mouth daily as needed for anxiety., Disp: 30 tablet, Rfl: 0 .  traZODone  (DESYREL) 50 MG tablet, Take 0.5-1 tablets (25-50 mg total) by mouth at bedtime as needed for sleep., Disp: 30 tablet, Rfl: 0  EXAM:  VITALS per patient if applicable:  GENERAL: alert, oriented, appears well and in no acute distress  PSYCH/NEURO: pleasant and cooperative, no obvious depression or anxiety, speech and thought processing grossly intact  ASSESSMENT AND PLAN:  Discussed the following assessment and plan:  Annual physical exam -  Had flu shot fall 2018 and 2019 with [facility] Tdap had 08/19/16 Declines STD testing  Pap 07/04/17 neg neg HPV  rec smoking cessation 4-5 cig/qd on and off since age 76 y.o rec take D3 2000 IU qd otc rec healthy diet and exercise   Vitamin D deficiency - Plan: Vitamin D (25 hydroxy)  GAD (generalized anxiety disorder) - Plan: ALPRAZolam (XANAX) 0.25 MG tablet qd [rm   Insomnia, unspecified type - Plan: traZODone (DESYREL) 25-50 MG tablet qhs prn let me know in 2 weeks how doing     I discussed the assessment and treatment plan with the patient. The patient was provided an opportunity to ask questions and all were answered. The patient agreed with the plan and demonstrated an understanding of the instructions.   The patient was advised to call back or seek an in-person  evaluation if the symptoms worsen or if the condition fails to improve as anticipated.  Time spent 15 minutes  Bevelyn Bucklesracy N McLean-Scocuzza, MD

## 2019-07-26 ENCOUNTER — Other Ambulatory Visit: Payer: 59

## 2019-08-04 DIAGNOSIS — R071 Chest pain on breathing: Secondary | ICD-10-CM | POA: Diagnosis not present

## 2019-08-04 DIAGNOSIS — R0789 Other chest pain: Secondary | ICD-10-CM | POA: Diagnosis not present

## 2019-08-08 ENCOUNTER — Encounter: Payer: Self-pay | Admitting: Internal Medicine

## 2019-08-15 ENCOUNTER — Other Ambulatory Visit: Payer: 59

## 2019-10-28 ENCOUNTER — Other Ambulatory Visit: Payer: Self-pay | Admitting: Internal Medicine

## 2019-10-28 DIAGNOSIS — F411 Generalized anxiety disorder: Secondary | ICD-10-CM

## 2019-10-28 MED ORDER — ALPRAZOLAM 0.25 MG PO TABS: 0.2500 mg | ORAL_TABLET | Freq: Every day | ORAL | 2 refills | Status: DC | PRN

## 2019-12-08 ENCOUNTER — Encounter: Payer: Self-pay | Admitting: Internal Medicine

## 2020-01-10 ENCOUNTER — Encounter: Payer: Self-pay | Admitting: Internal Medicine

## 2020-01-24 ENCOUNTER — Ambulatory Visit: Payer: 59 | Admitting: Internal Medicine

## 2020-03-22 ENCOUNTER — Other Ambulatory Visit: Payer: Self-pay

## 2020-03-22 ENCOUNTER — Encounter (HOSPITAL_COMMUNITY): Payer: Self-pay

## 2020-03-22 ENCOUNTER — Emergency Department (HOSPITAL_COMMUNITY)
Admission: EM | Admit: 2020-03-22 | Discharge: 2020-03-22 | Disposition: A | Discharge status: Home / Self Care | Payer: PRIVATE HEALTH INSURANCE | Attending: Emergency Medicine | Admitting: Emergency Medicine

## 2020-03-22 DIAGNOSIS — T148XXA Other injury of unspecified body region, initial encounter: Secondary | ICD-10-CM

## 2020-03-22 DIAGNOSIS — Y990 Civilian activity done for income or pay: Secondary | ICD-10-CM | POA: Diagnosis not present

## 2020-03-22 DIAGNOSIS — W460XXA Contact with hypodermic needle, initial encounter: Secondary | ICD-10-CM | POA: Insufficient documentation

## 2020-03-22 DIAGNOSIS — Y939 Activity, unspecified: Secondary | ICD-10-CM | POA: Insufficient documentation

## 2020-03-22 DIAGNOSIS — Y92238 Other place in hospital as the place of occurrence of the external cause: Secondary | ICD-10-CM | POA: Diagnosis not present

## 2020-03-22 DIAGNOSIS — F17210 Nicotine dependence, cigarettes, uncomplicated: Secondary | ICD-10-CM | POA: Diagnosis not present

## 2020-03-22 DIAGNOSIS — S51832A Puncture wound without foreign body of left forearm, initial encounter: Secondary | ICD-10-CM | POA: Insufficient documentation

## 2020-03-22 MED ORDER — KETOROLAC TROMETHAMINE 15 MG/ML IJ SOLN
15.0000 mg | Freq: Once | INTRAMUSCULAR | Status: AC
  Administered 2020-03-22: 15 mg via INTRAMUSCULAR
  Filled 2020-03-22: qty 1

## 2020-03-22 MED ORDER — KETOROLAC TROMETHAMINE 15 MG/ML IJ SOLN: 15.0000 mg | Freq: Once | INTRAMUSCULAR | Status: DC

## 2020-03-22 NOTE — ED Notes (Signed)
Patient verbalizes understanding of discharge instructions. Opportunity for questioning and answers were provided. Armband removed by staff, pt discharged from ED ambulatory.   

## 2020-03-22 NOTE — ED Provider Notes (Signed)
[facility] EMERGENCY DEPARTMENT Provider Note   CSN: [id number] Arrival date & time: 03/22/20  8546     History Chief Complaint  Patient presents with  . Puncture Wound    Sarah West is a 34 y.o. female.  HPI HPI Comments: Sarah West is a 34 y.o. female who presents to the Emergency Department complaining of a needlestick.  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  She notes having a history of anxiety and initially had a "panic attack".  She states she felt nauseous and had tingling in her fingertips but states the symptoms have improved.  She states this episode is similar to her prior panic attacks.  She reports associated pain, erythema, edema in the left medial forearm surrounding the site of the needlestick.  Her pain significantly worsens with any movement of the left hand or wrist as well as any palpation of the prior mentioned region.  She denies any other symptoms at this time.     Past Medical History:  Diagnosis Date  . Anxiety   . Asthma   . History of cervical dysplasia    s/p cryotherapy 8 years ago  . History of genital warts   . Hx of migraines    teenager   . Seizure ([facility])    grand mal  . UTI (urinary tract infection)     Patient Active Problem List   Diagnosis Date Noted  . Annual physical exam 07/18/2019  . Insomnia 07/18/2019  . Dizziness 10/25/2018  . Tingling in extremities 10/25/2018  . Vitamin D deficiency 10/25/2018  . Fatigue 05/28/2018  . GAD (generalized anxiety disorder) 05/28/2018  . Migraine 05/28/2018  . Allergic rhinitis 10/21/2016    Past Surgical History:  Procedure Laterality Date  . TONSILLECTOMY     2009  . WISDOM TOOTH EXTRACTION       OB History    Gravida  0   Para  0   Term  0   Preterm  0   AB  0   Living  0     SAB  0   TAB  0   Ectopic  0   Multiple  0   Live Births  0           Family History  Problem Relation Age of Onset  . Asthma Mother   . Breast cancer Maternal Grandmother   . Cancer Maternal Grandmother        breast  . Stroke Maternal Grandmother   . Heart disease Paternal Grandfather   . Cancer Maternal Aunt   . Cervical cancer Other     Social History   Tobacco Use  . Smoking status: Current Every Day Smoker    Packs/day: 0.25    Types: Cigarettes  . Smokeless tobacco: Never Used  Substance Use Topics  . Alcohol use: Yes    Alcohol/week: 2.0 standard drinks    Types: 2 Glasses of wine per week  . Drug use: No    Home Medications Prior to Admission medications   Medication Sig Start Date End Date Taking? Authorizing Provider  ALPRAZolam (XANAX) 0.25 MG tablet Take 1 tablet (0.25 mg total) by mouth daily as needed for anxiety. 10/28/19   McLean-Scocuzza, Pasty Spillers, MD  traZODone (DESYREL) 50 MG tablet Take 0.5-1 tablets (25-50 mg total) by mouth at bedtime as needed for sleep. 07/18/19  McLean-Scocuzza, Nino Glow, MD    Allergies    Patient has no known allergies.  Review of Systems   Review of Systems  Musculoskeletal: Positive for arthralgias, joint swelling and myalgias.  Skin: Positive for color change and wound.  Neurological: Negative for syncope, weakness and numbness.   Physical Exam Updated Vital Signs BP 117/73   Pulse 98   Temp 97.6 F (36.4 C) (Oral)   Resp 16   Ht 5\' 2"  (1.575 m)   Wt 68 kg   SpO2 100%   BMI 27.44 kg/m   Physical Exam Vitals and nursing note reviewed.  Constitutional:      General: She is not in acute distress.    Appearance: Normal appearance. She is normal weight. She is not ill-appearing, toxic-appearing or diaphoretic.  HENT:     Head: Normocephalic and atraumatic.     Nose: Nose normal.  Eyes:     Extraocular Movements: Extraocular movements intact.  Cardiovascular:     Rate and Rhythm: Normal rate.     Pulses: Normal pulses.  Pulmonary:       Effort: Pulmonary effort is normal.  Abdominal:     General: Abdomen is flat.  Musculoskeletal:        General: Swelling, tenderness and signs of injury present. No deformity.     Cervical back: Normal range of motion.     Comments: Small wound consistent with needlestick injury noted to the left distal medial forearm.  Mild edema noted surrounding the site and distally to the left ulnar wrist.  Moderate TTP noted overlying the prior mentioned region.  Patient has full range of motion of the fingers of the left hand.  Grip strength is 5/5.  Distal sensation is intact.  2+ radial pulses noted bilaterally.  Cap refill is less than 2 seconds.  Skin:    Capillary Refill: Capillary refill takes less than 2 seconds.  Neurological:     General: No focal deficit present.     Mental Status: She is alert and oriented to person, place, and time.  Psychiatric:        Mood and Affect: Mood normal.        Behavior: Behavior normal.    ED Results / Procedures / Treatments   Labs (all labs ordered are listed, but only abnormal results are displayed) Labs Reviewed - No data to display  EKG None  Radiology No results found.  Procedures Procedures   Medications Ordered in ED Medications  ketorolac (TORADOL) 15 MG/ML injection 15 mg (15 mg Intramuscular Given 03/22/20 1033)    ED Course  I have reviewed the triage vital signs and the nursing notes.  Pertinent labs & imaging results that were available during my care of the patient were reviewed by me and considered in my medical decision making (see chart for details).    MDM Rules/Calculators/A&P                      10:24 AM patient is a pleasant 34 year old female that works at [employer] and was stuck with a sterile 16-gauge needle to the left medial forearm.  She has significant pain surrounding the site and a moderate amount of swelling.  Her physical exam is reassuring and with multiple rechecks is neurovascularly intact distally  to the site.  I discussed this patient with my coworker PA Joy who assisted me and ultrasounding her left wrist showing good distal radial and ulnar artery flow.  Her arm  compartments are soft with multiple rechecks.  We will have nursing staff give patient warm compresses as well as shot of Toradol.  We will monitor her and if her pain improves will discharge.  11:15 AM patient states her pain is moderately alleviated.  Upon evaluation the edema in her left forearm is also moderately alleviated.  Compartments are still soft and she is neurovascularly intact.  She states she is ready to be discharged.  She was given strict return precautions and understands to return to the emergency department with any new or worsening symptoms.  Her questions were answered and she was amicable at the time of discharge.  Vital signs stable.  Final Clinical Impression(s) / ED Diagnoses Final diagnoses:  Puncture wound    Rx / DC Orders ED Discharge Orders    None       Placido Sou, PA-C 03/22/20 1144    Margarita Grizzle, MD 03/23/20 1447

## 2020-03-22 NOTE — ED Triage Notes (Signed)
Pt presents to ED from [facility] pharmacy department after accidentally puncturing left arm with empty syringe at approx 9AM today. Bandage covering puncture site, bleeding controlled; redness present surrounding area.

## 2020-03-22 NOTE — Discharge Instructions (Signed)
Prior discussion, please take ibuprofen and Tylenol as needed for management of your ongoing pain.  If you have any new or worsening symptoms or signs of infection please do not hesitate to return to the emergency department for reevaluation.  It was a pleasure to meet you.

## 2020-04-09 ENCOUNTER — Encounter: Payer: Self-pay | Admitting: Internal Medicine

## 2020-04-10 ENCOUNTER — Other Ambulatory Visit: Payer: Self-pay | Admitting: Internal Medicine

## 2020-04-10 DIAGNOSIS — F411 Generalized anxiety disorder: Secondary | ICD-10-CM

## 2020-04-10 MED ORDER — ALPRAZOLAM 0.25 MG PO TABS: 0.2500 mg | ORAL_TABLET | Freq: Every day | ORAL | 2 refills | Status: DC | PRN

## 2020-04-15 DIAGNOSIS — G5761 Lesion of plantar nerve, right lower limb: Secondary | ICD-10-CM | POA: Diagnosis not present

## 2020-04-30 ENCOUNTER — Encounter: Payer: Self-pay | Admitting: Internal Medicine

## 2020-05-12 ENCOUNTER — Telehealth: Payer: Self-pay

## 2020-05-12 NOTE — Telephone Encounter (Signed)
Pt called and is aware that [facility] had a meeting to attend and we had to reschedule her appointment for tomorrow 05/13/2020. Pt seem to be a little disappointment and stated that she had took off to attend the appointment and was really concerned about her vaginal bleeding. Pt is aware that if anyone cancel tomorrow we would notify her. Pt voiced that she understood.

## 2020-05-13 ENCOUNTER — Other Ambulatory Visit: Payer: Self-pay

## 2020-05-13 ENCOUNTER — Ambulatory Visit (INDEPENDENT_AMBULATORY_CARE_PROVIDER_SITE_OTHER): Payer: 59 | Admitting: Obstetrics and Gynecology

## 2020-05-13 ENCOUNTER — Encounter: Payer: 59 | Admitting: Obstetrics and Gynecology

## 2020-05-13 ENCOUNTER — Encounter: Payer: Self-pay | Admitting: Obstetrics and Gynecology

## 2020-05-13 ENCOUNTER — Other Ambulatory Visit (HOSPITAL_COMMUNITY)
Admission: RE | Admit: 2020-05-13 | Discharge: 2020-05-13 | Disposition: A | Discharge status: Home / Self Care | Payer: 59 | Source: Ambulatory Visit | Attending: Obstetrics and Gynecology | Admitting: Obstetrics and Gynecology

## 2020-05-13 VITALS — BP 123/86 | HR 80 | Ht 62.0 in | Wt 158.5 lb

## 2020-05-13 DIAGNOSIS — N939 Abnormal uterine and vaginal bleeding, unspecified: Secondary | ICD-10-CM | POA: Diagnosis not present

## 2020-05-13 DIAGNOSIS — Z124 Encounter for screening for malignant neoplasm of cervix: Secondary | ICD-10-CM | POA: Diagnosis not present

## 2020-05-13 DIAGNOSIS — Z1272 Encounter for screening for malignant neoplasm of vagina: Secondary | ICD-10-CM | POA: Diagnosis not present

## 2020-05-13 DIAGNOSIS — N907 Vulvar cyst: Secondary | ICD-10-CM | POA: Diagnosis not present

## 2020-05-13 MED ORDER — MEDROXYPROGESTERONE ACETATE 10 MG PO TABS
10.0000 mg | ORAL_TABLET | Freq: Every day | ORAL | 2 refills | Status: DC
Start: 2020-05-13 — End: 2020-07-17

## 2020-05-13 NOTE — Progress Notes (Signed)
    GYNECOLOGY PROGRESS NOTE  Subjective:    Patient ID: Sarah West, female    DOB: 20-Jan-1986, 34 y.o.   MRN: [id number]  HPI  Patient is a 34 y.o. [id number] female who presents for complaints of irregular cycle this month. Dysmenorrhea associated (although slightly less). Passage of clots with brown-red bleeding x 1 week. Family history of fibroids in her mom.   Also note bump on right labia minora that appeared ~ 1 week ago. Notes that she looked and didn't see anything, but could feel it. This is first occurrence.    Gynecologic History:  Menarche: 13 Cycles regularly 28 days. Moderate dysmenorrhea, managed with Ibuprofen and Tylenol. Cycles last 5 days. Moderate flow, with heavy bleeding at onset of cycle x 1 day.   Contraception: vasectomy   The following portions of the patient's history were reviewed and updated as appropriate:  She  has a past medical history of Anxiety, Asthma, History of cervical dysplasia, History of genital warts, migraines, Seizure ([facility]), and UTI (urinary tract infection). She  has a past surgical history that includes Tonsillectomy and Wisdom tooth extraction. Her family history includes Asthma in her mother; Breast cancer in her maternal grandmother; Cancer in her maternal aunt and maternal grandmother; Cervical cancer in an other family member; Heart disease in her paternal grandfather; Stroke in her maternal grandmother. She  reports that she has been smoking cigarettes. She has been smoking about 0.25 packs per day. She has never used smokeless tobacco. She reports current alcohol use of about 2.0 standard drinks of alcohol per week. She reports that she does not use drugs. Current Outpatient Medications on File Prior to Visit  Medication Sig Dispense Refill  . ALPRAZolam (XANAX) 0.25 MG tablet Take 1 tablet (0.25 mg total) by mouth daily as needed for anxiety. 30 tablet 2  . ibuprofen (ADVIL) 200 MG tablet Take 200 mg by mouth every 6 (six) hours as  needed.     No current facility-administered medications on file prior to visit.   She has No Known Allergies..  Review of Systems Pertinent items noted in HPI and remainder of comprehensive ROS otherwise negative.   Objective:   Blood pressure 123/86, pulse 80, height 5\' 2"  (1.575 m), weight 158 lb 8 oz (71.9 kg). General appearance: alert and no distress Abdomen: soft, non-tender; bowel sounds normal; no masses,  no organomegaly Pelvic: external genitalia normal, rectovaginal septum normal.  Vagina without discharge.  Cervix normal appearing, no lesions and no motion tenderness.  Uterus mobile, nontender, normal shape and size.  Adnexae non-palpable, nontender bilaterally.  Extremities: extremities normal, atraumatic, no cyanosis or edema.  Neurologic: Grossly normal   Assessment:   1. Abnormal uterine bleeding   2. Labial cyst   3. Cervical cancer screening     Plan:   1. Patient has abnormal uterine bleeding x 1 month. She has a normal exam, no evidence of lesions.  Will order abnormal uterine bleeding evaluation labs and pelvic ultrasound to evaluate for any structural gynecologic abnormalities.  Will contact patient with these results and plans for further evaluation/management. Given prescription for Provera 10 mg x 10 days for management.  2. Pap smear performed as patient is due.  3. Labial cyst noted, likely sebaceous cyst. Discussed benign findings. Will likely resolve without any interventions.    , MD [facility]

## 2020-05-13 NOTE — Progress Notes (Signed)
Pt present due to irregular cycles that are heavy with clots and a lot of cramping and pain. Pt stated starting cycle and then 1-2 weeks later it would come on again. Pt stated that she noticed the changes in her cycle this month.

## 2020-05-14 ENCOUNTER — Ambulatory Visit: Payer: 59

## 2020-05-14 LAB — CBC
Hematocrit: 39 % (ref 34.0–46.6)
Hemoglobin: 13 g/dL (ref 11.1–15.9)
MCH: 31.1 pg (ref 26.6–33.0)
MCHC: 33.3 g/dL (ref 31.5–35.7)
MCV: 93 fL (ref 79–97)
Platelets: 345 10*3/uL (ref 150–450)
RBC: 4.18 x10E6/uL (ref 3.77–5.28)
RDW: 12.1 % (ref 11.7–15.4)
WBC: 7.9 10*3/uL (ref 3.4–10.8)

## 2020-05-14 LAB — ESTRADIOL: Estradiol: 30.2 pg/mL

## 2020-05-14 LAB — FSH/LH
FSH: 3.6 m[IU]/mL
LH: 5.2 m[IU]/mL

## 2020-05-14 LAB — TSH: TSH: 1.69 u[IU]/mL (ref 0.450–4.500)

## 2020-05-14 LAB — PROGESTERONE: Progesterone: 0.7 ng/mL

## 2020-05-15 NOTE — Progress Notes (Signed)
So sorry. There was an old order in there from you for this lab that I thought I discontinued.  But thank you for reviewing.   Dr. Valentino Saxon

## 2020-05-21 LAB — CYTOLOGY - PAP
Comment: NEGATIVE
Diagnosis: NEGATIVE
High risk HPV: NEGATIVE

## 2020-06-02 ENCOUNTER — Ambulatory Visit (INDEPENDENT_AMBULATORY_CARE_PROVIDER_SITE_OTHER): Payer: 59 | Admitting: Obstetrics and Gynecology

## 2020-06-02 ENCOUNTER — Encounter: Payer: Self-pay | Admitting: Obstetrics and Gynecology

## 2020-06-02 ENCOUNTER — Ambulatory Visit (INDEPENDENT_AMBULATORY_CARE_PROVIDER_SITE_OTHER): Payer: 59

## 2020-06-02 ENCOUNTER — Other Ambulatory Visit: Payer: Self-pay

## 2020-06-02 VITALS — BP 111/77 | HR 90 | Ht 62.0 in | Wt 160.7 lb

## 2020-06-02 DIAGNOSIS — N83202 Unspecified ovarian cyst, left side: Secondary | ICD-10-CM | POA: Diagnosis not present

## 2020-06-02 DIAGNOSIS — Z8619 Personal history of other infectious and parasitic diseases: Secondary | ICD-10-CM

## 2020-06-02 DIAGNOSIS — R102 Pelvic and perineal pain: Secondary | ICD-10-CM

## 2020-06-02 DIAGNOSIS — N926 Irregular menstruation, unspecified: Secondary | ICD-10-CM

## 2020-06-02 DIAGNOSIS — N939 Abnormal uterine and vaginal bleeding, unspecified: Secondary | ICD-10-CM

## 2020-06-02 NOTE — Patient Instructions (Signed)
Ovarian Cyst     An ovarian cyst is a fluid-filled sac that forms on an ovary. The ovaries are small organs that produce eggs in women. Various types of cysts can form on the ovaries. Some may cause symptoms and require treatment. Most ovarian cysts go away on their own, are not cancerous (are benign), and do not cause problems. Common types of ovarian cysts include:  Functional (follicle) cysts. ? Occur during the menstrual cycle, and usually go away with the next menstrual cycle if you do not get pregnant. ? Usually cause no symptoms.  Endometriomas. ? Are cysts that form from the tissue that lines the uterus (endometrium). ? Are sometimes called "chocolate cysts" because they become filled with blood that turns brown. ? Can cause pain in the lower abdomen during intercourse and during your period.  Cystadenoma cysts. ? Develop from cells on the outside surface of the ovary. ? Can get very large and cause lower abdomen pain and pain with intercourse. ? Can cause severe pain if they twist or break open (rupture).  Dermoid cysts. ? Are sometimes found in both ovaries. ? May contain different kinds of body tissue, such as skin, teeth, hair, or cartilage. ? Usually do not cause symptoms unless they get very big.  Theca lutein cysts. ? Occur when too much of a certain hormone (human chorionic gonadotropin) is produced and overstimulates the ovaries to produce an egg. ? Are most common after having procedures used to assist with the conception of a baby (in vitro fertilization). What are the causes? Ovarian cysts may be caused by:  Ovarian hyperstimulation syndrome. This is a condition that can develop from taking fertility medicines. It causes multiple large ovarian cysts to form.  Polycystic ovarian syndrome (PCOS). This is a common hormonal disorder that can cause ovarian cysts, as well as problems with your period or fertility. What increases the risk? The following factors may  make you more likely to develop ovarian cysts:  Being overweight or obese.  Taking fertility medicines.  Taking certain forms of hormonal birth control.  Smoking. What are the signs or symptoms? Many ovarian cysts do not cause symptoms. If symptoms are present, they may include:  Pelvic pain or pressure.  Pain in the lower abdomen.  Pain during sex.  Abdominal swelling.  Abnormal menstrual periods.  Increasing pain with menstrual periods. How is this diagnosed? These cysts are commonly found during a routine pelvic exam. You may have tests to find out more about the cyst, such as:  Ultrasound.  X-ray of the pelvis.  CT scan.  MRI.  Blood tests. How is this treated? Many ovarian cysts go away on their own without treatment. Your health care provider may want to check your cyst regularly for 2-3 months to see if it changes. If you are in menopause, it is especially important to have your cyst monitored closely because menopausal women have a higher rate of ovarian cancer. When treatment is needed, it may include:  Medicines to help relieve pain.  A procedure to drain the cyst (aspiration).  Surgery to remove the whole cyst.  Hormone treatment or birth control pills. These methods are sometimes used to help dissolve a cyst. Follow these instructions at home:  Take over-the-counter and prescription medicines only as told by your health care provider.  Do not drive or use heavy machinery while taking prescription pain medicine.  Get regular pelvic exams and Pap tests as often as told by your health care provider.    Return to your normal activities as told by your health care provider. Ask your health care provider what activities are safe for you.  Do not use any products that contain nicotine or tobacco, such as cigarettes and e-cigarettes. If you need help quitting, ask your health care provider.  Keep all follow-up visits as told by your health care provider.  This is important. Contact a health care provider if:  Your periods are late, irregular, or painful, or they stop.  You have pelvic pain that does not go away.  You have pressure on your bladder or trouble emptying your bladder completely.  You have pain during sex.  You have any of the following in your abdomen: ? A feeling of fullness. ? Pressure. ? Discomfort. ? Pain that does not go away. ? Swelling.  You feel generally ill.  You become constipated.  You lose your appetite.  You develop severe acne.  You start to have more body hair and facial hair.  You are gaining weight or losing weight without changing your exercise and eating habits.  You think you may be pregnant. Get help right away if:  You have abdominal pain that is severe or gets worse.  You cannot eat or drink without vomiting.  You suddenly develop a fever.  Your menstrual period is much heavier than usual. This information is not intended to replace advice given to you by your health care provider. Make sure you discuss any questions you have with your health care provider. Document Revised: 03/05/2018 Document Reviewed: 05/08/2016 Elsevier Patient Education  2020 [employer]  

## 2020-06-02 NOTE — Progress Notes (Signed)
Pt present for ultrasound and follow up ultrasound results. Pt stated having stabbing lower abd pain off and on.

## 2020-06-02 NOTE — Progress Notes (Signed)
GYNECOLOGY PROGRESS NOTE  Subjective:    Patient ID: Sarah West, female    DOB: 1986-03-06, 34 y.o.   MRN: [id number]  HPI  Patient is a 34 y.o. G0P0000 female who presents for review of ultrasound and labs.  She was seen ~ 2 weeks ago for complaints of irregular bleeding (spotting/light bleeding x 1 week, prior to onset of menses) and left sided pelvic pain x 1 month. Has a family history of fibroids. Reports that she did not take the Provera tablets as prescribed last visit, as the day after her last visit she noted the onset of her menses.  Cycle lasted 3-4 days and now all bleeding has subsided.   The following portions of the patient's history were reviewed and updated as appropriate: allergies, current medications, past family history, past medical history, past social history, past surgical history and problem list.  Review of Systems Pertinent items noted in HPI and remainder of comprehensive ROS otherwise negative.   Objective:   Blood pressure 111/77, pulse 90, height 5\' 2"  (1.575 m), weight 160 lb 11.2 oz (72.9 kg), last menstrual period 05/13/2020. General appearance: alert and no distress Remainder of exam deferred.    Labs:  Results for orders placed or performed in visit on 05/13/20  TSH  Result Value Ref Range   TSH 1.690 0.450 - 4.500 uIU/mL  CBC  Result Value Ref Range   WBC 7.9 3.4 - 10.8 x10E3/uL   RBC 4.18 3.77 - 5.28 x10E6/uL   Hemoglobin 13.0 11.1 - 15.9 g/dL   Hematocrit 05/15/20 06.2 - 46.6 %   MCV 93 79 - 97 fL   MCH 31.1 26.6 - 33.0 pg   MCHC 33.3 31 - 35 g/dL   RDW 37.6 28.3 - 15.1 %   Platelets 345 150 - 450 x10E3/uL  FSH/LH  Result Value Ref Range   LH 5.2 mIU/mL   FSH 3.6 mIU/mL  Estradiol  Result Value Ref Range   Estradiol 30.2 pg/mL  Progesterone  Result Value Ref Range   Progesterone 0.7 ng/mL  Cytology - PAP  Result Value Ref Range   High risk HPV Negative    Adequacy      Satisfactory for evaluation; transformation zone  component PRESENT.   Diagnosis      - Negative for intraepithelial lesion or malignancy (NILM)   Comment Normal Reference Range HPV - Negative      Imaging:  Patient Name: Sarah West DOB: [birth year] MRN: [id number] ULTRASOUND REPORT  Location: [facility]  Date of Service: 06/02/2020     Indications:AUB Findings:  The uterus is anteverted and measures 6.7. x 3.0 x 4.7 cmEcho texture is homogenous without evidence of focal masses.  The Endometrium measures 6 mm.  Right Ovary measures 3.6 x 2.1 x 3.2 cm. It is normal in appearance. Left Ovary measures 3.6 x 2.6 x 2.7 cm. It is normal in appearance. Hypoechoic lesion with smooth walls and good through transmission measuring 1.9 x 15 x 2.1 cm Survey of the adnexa demonstrates no adnexal masses. There is no free fluid in the cul de sac.  Impression: 1. Lt ovarian cyst as described above.  Recommendations: 1.Clinical correlation with the patient's History and Physical Exam.   Jenine M. 06/04/2020    RDMS  Assessment:   Pelvic pain Left ovarian cyst Irregular menstrual cycle History of HPV infection  Plan:   1. Patient notes pain is usually manageable with NSAIDs. Can continue.  2. Discussed findings of ultrasound, left  ovarian cyst present. Benign, small. Given reassurance that cyst will likely resolve without intervention, however in light of most recent abnormal cycle, could consider a trial of OCPs.  Patient notes she would just like expectant management for now.  3. Irregular menstrual cycle could have been caused by a hormonal fluctuation. Ultrasound with no evidence of fibroids or other structural uterine abnormalities. Labs overall normal. Can manage expectantly for now.  4. History of HPV infection, patient very concerned on how symptoms may be related to HPV. Reassured patient that this was not the case. Also, recent pap smear done at last visit was HPV negative, and normal cytology. Answered all  questions.   Return to clinic for any scheduled appointments or for any gynecologic concerns as needed.    A total of 15 minutes were spent face-to-face with the patient during this encounter and over half of that time dealt with counseling and review of labs/imaging.    Rubie Maid, MD [facility]

## 2020-06-12 DIAGNOSIS — Z23 Encounter for immunization: Secondary | ICD-10-CM | POA: Diagnosis not present

## 2020-06-17 ENCOUNTER — Encounter: Payer: Self-pay | Admitting: Internal Medicine

## 2020-07-17 ENCOUNTER — Other Ambulatory Visit: Payer: Self-pay

## 2020-07-17 ENCOUNTER — Encounter: Payer: Self-pay | Admitting: Internal Medicine

## 2020-07-17 ENCOUNTER — Ambulatory Visit (INDEPENDENT_AMBULATORY_CARE_PROVIDER_SITE_OTHER): Payer: 59 | Admitting: Internal Medicine

## 2020-07-17 VITALS — BP 112/78 | HR 76 | Temp 98.5°F | Ht 62.0 in | Wt 160.2 lb

## 2020-07-17 DIAGNOSIS — F411 Generalized anxiety disorder: Secondary | ICD-10-CM | POA: Diagnosis not present

## 2020-07-17 DIAGNOSIS — Z0000 Encounter for general adult medical examination without abnormal findings: Secondary | ICD-10-CM | POA: Diagnosis not present

## 2020-07-17 DIAGNOSIS — Z13818 Encounter for screening for other digestive system disorders: Secondary | ICD-10-CM | POA: Diagnosis not present

## 2020-07-17 DIAGNOSIS — E663 Overweight: Secondary | ICD-10-CM

## 2020-07-17 LAB — LIPID PANEL
Cholesterol: 169 mg/dL (ref 0–200)
HDL: 65.4 mg/dL (ref >39.00)
LDL Cholesterol: 92 mg/dL (ref 0–99)
NonHDL: 103.77
Total CHOL/HDL Ratio: 3
Triglycerides: 61 mg/dL (ref 0.0–149.0)
VLDL: 12.2 mg/dL (ref 0.0–40.0)

## 2020-07-17 LAB — COMPREHENSIVE METABOLIC PANEL
ALT: 16 U/L (ref 0–35)
AST: 13 U/L (ref 0–37)
Albumin: 4.2 g/dL (ref 3.5–5.2)
Alkaline Phosphatase: 54 U/L (ref 39–117)
BUN: 11 mg/dL (ref 6–23)
CO2: 28 mEq/L (ref 19–32)
Calcium: 9.3 mg/dL (ref 8.4–10.5)
Chloride: 103 mEq/L (ref 96–112)
Creatinine, Ser: 0.61 mg/dL (ref 0.40–1.20)
GFR: 111.48 mL/min (ref >60.00)
Glucose, Bld: 96 mg/dL (ref 70–99)
Potassium: 4 mEq/L (ref 3.5–5.1)
Sodium: 136 mEq/L (ref 135–145)
Total Bilirubin: 0.5 mg/dL (ref 0.2–1.2)
Total Protein: 6.5 g/dL (ref 6.0–8.3)

## 2020-07-17 LAB — CBC WITH DIFFERENTIAL/PLATELET
Basophils Absolute: 0 10*3/uL (ref 0.0–0.1)
Basophils Relative: 0.6 % (ref 0.0–3.0)
Eosinophils Absolute: 0.3 10*3/uL (ref 0.0–0.7)
Eosinophils Relative: 3.5 % (ref 0.0–5.0)
HCT: 38.7 % (ref 36.0–46.0)
Hemoglobin: 12.9 g/dL (ref 12.0–15.0)
Lymphocytes Relative: 25.6 % (ref 12.0–46.0)
Lymphs Abs: 1.9 10*3/uL (ref 0.7–4.0)
MCHC: 33.2 g/dL (ref 30.0–36.0)
MCV: 92.4 fl (ref 78.0–100.0)
Monocytes Absolute: 0.5 10*3/uL (ref 0.1–1.0)
Monocytes Relative: 6.4 % (ref 3.0–12.0)
Neutro Abs: 4.8 10*3/uL (ref 1.4–7.7)
Neutrophils Relative %: 63.9 % (ref 43.0–77.0)
Platelets: 364 10*3/uL (ref 150.0–400.0)
RBC: 4.19 Mil/uL (ref 3.87–5.11)
RDW: 12.8 % (ref 11.5–15.5)
WBC: 7.5 10*3/uL (ref 4.0–10.5)

## 2020-07-17 LAB — VITAMIN B12: Vitamin B-12: 248 pg/mL (ref 211–911)

## 2020-07-17 LAB — VITAMIN D 25 HYDROXY (VIT D DEFICIENCY, FRACTURES): VITD: 25.06 ng/mL — ABNORMAL LOW (ref 30.00–100.00)

## 2020-07-17 MED ORDER — ALPRAZOLAM 0.25 MG PO TABS: 0.2500 mg | ORAL_TABLET | Freq: Every day | ORAL | 2 refills | Status: DC | PRN

## 2020-07-17 MED ORDER — PHENTERMINE HCL 37.5 MG PO TABS: 18.2500 mg | ORAL_TABLET | Freq: Every day | ORAL | 0 refills | Status: DC

## 2020-07-17 NOTE — Progress Notes (Signed)
Chief Complaint  Patient presents with  . Annual Exam  . Weight Loss   Annual doing well  1. Anxiety/insomnia needs refill of xanax 0.25 prn  2. C/o weight gain gaol 130s though exercising with elliptical and trying to eat right    Review of Systems  Constitutional: Positive for malaise/fatigue. Negative for weight loss.  HENT: Negative for hearing loss.   Eyes: Negative for blurred vision.  Respiratory: Negative for shortness of breath.   Cardiovascular: Negative for chest pain.  Musculoskeletal: Negative for falls.  Skin: Negative for rash.  Neurological: Negative for headaches.  Psychiatric/Behavioral: The patient is nervous/anxious and has insomnia.    Past Medical History:  Diagnosis Date  . Anxiety   . Asthma   . History of cervical dysplasia    s/p cryotherapy 8 years ago  . History of genital warts   . Hx of migraines    teenager   . Seizure ([facility])    grand mal  . UTI (urinary tract infection)    Past Surgical History:  Procedure Laterality Date  . TONSILLECTOMY     2009  . WISDOM TOOTH EXTRACTION     Family History  Problem Relation Age of Onset  . Asthma Mother   . Breast cancer Maternal Grandmother   . Cancer Maternal Grandmother        breast  . Stroke Maternal Grandmother   . Heart disease Paternal Grandfather   . Cancer Maternal Aunt   . Cervical cancer Other    Social History   Socioeconomic History  . Marital status: Married    Spouse name: Not on file  . Number of children: Not on file  . Years of education: Not on file  . Highest education level: Not on file  Occupational History  . Not on file  Tobacco Use  . Smoking status: Former Smoker    Packs/day: 0.25    Types: Cigarettes  . Smokeless tobacco: Never Used  Vaping Use  . Vaping Use: Never used  Substance and Sexual Activity  . Alcohol use: Yes    Alcohol/week: 2.0 standard drinks    Types: 2 Glasses of wine per week  . Drug use: No  . Sexual activity: Yes    Birth  control/protection: Surgical, Other-see comments    Comment: vastormy  Other Topics Concern  . Not on file  Social History Narrative   Married since 2010    No kids    Works at [employer]    No guns    Wears seat belt    Safe in relationship    Social Determinants of Corporate investment banker Strain:   . Difficulty of Paying Living Expenses:   Food Insecurity:   . Worried About Programme researcher, broadcasting/film/video in the Last Year:   . Barista in the Last Year:   Transportation Needs:   . Freight forwarder (Medical):   Marland Kitchen Lack of Transportation (Non-Medical):   Physical Activity:   . Days of Exercise per Week:   . Minutes of Exercise per Session:   Stress:   . Feeling of Stress :   Social Connections:   . Frequency of Communication with Friends and Family:   . Frequency of Social Gatherings with Friends and Family:   . Attends Religious Services:   . Active Member of Clubs or Organizations:   . Attends Banker Meetings:   Marland Kitchen Marital Status:   Intimate Partner Violence:   .  Fear of Current or Ex-Partner:   . Emotionally Abused:   Marland Kitchen Physically Abused:   . Sexually Abused:    Current Meds  Medication Sig  . ibuprofen (ADVIL) 200 MG tablet Take 200 mg by mouth every 6 (six) hours as needed.   No Known Allergies Recent Results (from the past 2160 hour(s))  TSH     Status: None   Collection Time: 05/13/20  2:45 PM  Result Value Ref Range   TSH 1.690 0.450 - 4.500 uIU/mL  CBC     Status: None   Collection Time: 05/13/20  2:45 PM  Result Value Ref Range   WBC 7.9 3.4 - 10.8 x10E3/uL   RBC 4.18 3.77 - 5.28 x10E6/uL   Hemoglobin 13.0 11.1 - 15.9 g/dL   Hematocrit 84.6 96.2 - 46.6 %   MCV 93 79 - 97 fL   MCH 31.1 26.6 - 33.0 pg   MCHC 33.3 31 - 35 g/dL   RDW 95.2 84.1 - 32.4 %   Platelets 345 150 - 450 x10E3/uL  FSH/LH     Status: None   Collection Time: 05/13/20  2:45 PM  Result Value Ref Range   LH 5.2 mIU/mL    Comment:                     Adult  Female:                       Follicular phase      2.4 -  12.6                       Ovulation phase      14.0 -  95.6                       Luteal phase          1.0 -  11.4                       Postmenopausal        7.7 -  58.5    FSH 3.6 mIU/mL    Comment:                     Adult Female:                       Follicular phase      3.5 -  12.5                       Ovulation phase       4.7 -  21.5                       Luteal phase          1.7 -   7.7                       Postmenopausal       25.8 - 134.8   Estradiol     Status: None   Collection Time: 05/13/20  2:45 PM  Result Value Ref Range   Estradiol 30.2 pg/mL    Comment:                     Adult Female:  Follicular phase   12.5 -   166.0                       Ovulation phase    85.8 -   498.0                       Luteal phase       43.8 -   211.0                       Postmenopausal     <6.0 -    54.7                     Pregnancy                       1st trimester     215.0 - >4300.0 Roche ECLIA methodology   Progesterone     Status: None   Collection Time: 05/13/20  2:45 PM  Result Value Ref Range   Progesterone 0.7 ng/mL    Comment:                      Follicular phase       0.1 -   0.9                      Luteal phase           1.8 -  23.9                      Ovulation phase        0.1 -  12.0                      Pregnant                         First trimester    11.0 -  44.3                         Second trimester   25.4 -  83.3                         Third trimester    58.7 - 214.0                      Postmenopausal         0.0 -   0.1   Cytology - PAP     Status: None   Collection Time: 05/13/20  2:51 PM  Result Value Ref Range   High risk HPV Negative    Adequacy      Satisfactory for evaluation; transformation zone component PRESENT.   Diagnosis      - Negative for intraepithelial lesion or malignancy (NILM)   Comment Normal Reference Range HPV - Negative     Objective  Body mass index is 29.3 kg/m. Wt Readings from Last 3 Encounters:  07/17/20 160 lb 3.2 oz (72.7 kg)  06/02/20 160 lb 11.2 oz (72.9 kg)  05/13/20 158 lb 8 oz (71.9 kg)   Temp Readings from Last 3 Encounters:  07/17/20 98.5 F (36.9 C) (Oral)  03/22/20 97.6 F (36.4 C) (Oral)  10/25/18 98.6 F (37 C) (Oral)   BP Readings  from Last 3 Encounters:  07/17/20 112/78  06/02/20 111/77  05/13/20 123/86   Pulse Readings from Last 3 Encounters:  07/17/20 76  06/02/20 90  05/13/20 80    Physical Exam Vitals and nursing note reviewed.  Constitutional:      Appearance: Normal appearance. She is well-developed, well-groomed and overweight.  HENT:     Head: Normocephalic and atraumatic.  Eyes:     Conjunctiva/sclera: Conjunctivae normal.     Pupils: Pupils are equal, round, and reactive to light.  Cardiovascular:     Rate and Rhythm: Normal rate and regular rhythm.     Heart sounds: Normal heart sounds. No murmur heard.   Pulmonary:     Effort: Pulmonary effort is normal.     Breath sounds: Normal breath sounds.  Chest:     Chest wall: No mass.     Breasts: Breasts are symmetrical.        Right: Normal.        Left: Normal.  Abdominal:     General: Abdomen is flat. Bowel sounds are normal.     Tenderness: There is no abdominal tenderness.  Lymphadenopathy:     Upper Body:     Right upper body: No axillary adenopathy.     Left upper body: No axillary adenopathy.  Skin:    General: Skin is warm and dry.  Neurological:     General: No focal deficit present.     Mental Status: She is alert and oriented to person, place, and time. Mental status is at baseline.     Gait: Gait normal.  Psychiatric:        Attention and Perception: Attention and perception normal.        Mood and Affect: Mood and affect normal.        Speech: Speech normal.        Behavior: Behavior normal. Behavior is cooperative.        Thought Content: Thought content normal.         Cognition and Memory: Cognition and memory normal.        Judgment: Judgment normal.     Assessment  Plan  Annual physical exam -  Fasting labs today  Had flu shot fall 2018and 2019 with [facility] covid 2/2 moderna   Tdap had 08/19/16 Declines STD testing  Pap 07/04/17 neg neg HPV  rec smoking cessation 4-5 cig/qd on and off since age 34 y.o rec take D3 2000 IU qd otc rec healthy diet and exercise   Overweight (BMI 25.0-29.9) - Plan: phentermine (ADIPEX-P) 18.25 MG tablet qam mwt referred cone  GAD (generalized anxiety disorder) - Plan: ALPRAZolam (XANAX) 0.25 MG tablet qd prn    Provider: Dr. French Anaracy [facility]

## 2020-07-17 NOTE — Addendum Note (Signed)
Addended by: Warden Fillers on: 07/17/2020 10:39 AM   Modules accepted: Orders

## 2020-07-17 NOTE — Patient Instructions (Signed)
Premier protein shake with fruit  Chia/flaxseeds Berries, canteloupe    Budget-Friendly Healthy Eating There are many ways to save money at the grocery store and continue to eat healthy. You can be successful if you:  Plan meals according to your budget.  Make a grocery list and only purchase food according to your grocery list.  Prepare food yourself. What are tips for following this plan?  Reading food labels  Compare food labels between brand name foods and the store brand. Often the nutritional value is the same, but the store brand is lower cost.  Look for products that do not have added sugar, fat, or salt (sodium). These often cost the same but are healthier for you. Products may be labeled as: ? Sugar-free. ? Nonfat. ? Low-fat. ? Sodium-free. ? Low-sodium.  Look for lean ground beef labeled as at least 92% lean and 8% fat. Shopping  Buy only the items on your grocery list and go only to the areas of the store that have the items on your list.  Use coupons only for foods and brands you normally buy. Avoid buying items you wouldn't normally buy simply because they are on sale.  Check online and in newspapers for weekly deals.  Buy healthy items from the bulk bins when available, such as herbs, spices, flour, pasta, nuts, and dried fruit.  Buy fruits and vegetables that are in season. Prices are usually lower on in-season produce.  Look at the unit price on the price tag. Use it to compare different brands and sizes to find out which item is the best deal.  Choose healthy items that are often low-cost, such as carrots, potatoes, apples, bananas, and oranges. Dried or canned beans are a low-cost protein source.  Buy in bulk and freeze extra food. Items you can buy in bulk include meats, fish, poultry, frozen fruits, and frozen vegetables.  Avoid buying "ready-to-eat" foods, such as pre-cut fruits and vegetables and pre-made salads.  If possible, shop around to  discover where you can find the best prices. Consider other retailers such as dollar stores, larger [employer], local fruit and vegetable stands, and farmers markets.  Do not shop when you are hungry. If you shop while hungry, it may be hard to stick to your list and budget.  Resist impulse buying. Use your grocery list as your official plan for the week.  Buy a variety of vegetables and fruits by purchasing fresh, frozen, and canned items.  Look at the top and bottom shelves for deals. Foods at eye level (eye level of an adult or child) are usually more expensive.  Be efficient with your time when shopping. The more time you spend at the store, the more money you are likely to spend.  To save money when choosing more expensive foods like meats and dairy: ? Choose cheaper cuts of meat, such as bone-in chicken thighs and drumsticks instead of skinless and boneless chicken. When you are ready to prepare the chicken, you can remove the skin yourself to make it healthier. ? Choose lean meats like chicken or [place] instead of beef. ? Choose canned seafood, such as tuna, salmon, or sardines. ? Buy eggs as a low-cost source of protein. ? Buy dried beans and peas, such as lentils, split peas, or kidney beans instead of meats. Dried beans and peas are a good alternative source of protein. ? Buy the larger tubs of yogurt instead of individual-sized containers.  Choose water instead of sodas and other  sweetened beverages.  Avoid buying chips, cookies, and other "junk food." These items are usually expensive and not healthy. Cooking  Make extra food and freeze the extras in meal-sized containers or in individual portions for fast meals and snacks.  Pre-cook on days when you have extra time to prepare meals in advance. You can keep these meals in the fridge or freezer and reheat for a quick meal.  When you come home from the grocery store, wash, peel, and cut fruits and vegetables so they are  ready to use and eat. This will help reduce food waste. Meal planning  Do not eat out or get fast food. Prepare food at home.  Make a grocery list and make sure to bring it with you to the store. If you have a smart phone, you could use your phone to create your shopping list.  Plan meals and snacks according to a grocery list and budget you create.  Use leftovers in your meal plan for the week.  Look for recipes where you can cook once and make enough food for two meals.  Include budget-friendly meals like stews, casseroles, and stir-fry dishes.  Try some meatless meals or try "no cook" meals like salads.  Make sure that half your plate is filled with fruits or vegetables. Choose from fresh, frozen, or canned fruits and vegetables. If eating canned, remember to rinse them before eating. This will remove any excess salt added for packaging. Summary  Eating healthy on a budget is possible if you plan your meals according to your budget, purchase according to your budget and grocery list, and prepare food yourself.  Tips for buying more food on a limited budget include buying generic brands, using coupons only for foods you normally buy, and buying healthy items from the bulk bins when available.  Tips for buying cheaper food to replace expensive food include choosing cheaper, lean cuts of meat, and buying dried beans and peas. This information is not intended to replace advice given to you by your health care provider. Make sure you discuss any questions you have with your health care provider. Document Revised: 12/06/2017 Document Reviewed: 12/06/2017 Elsevier Patient Education  2020 [employer].

## 2020-07-17 NOTE — Progress Notes (Signed)
Patient presenting for physical. Would like to discuss options for weight loss.   Current status:  PATIENT IS OVERDUE FOR BMI FOLLOW UP PLAN BMI is estimated to be 29.3 based on the last recorded weight and height

## 2020-07-18 LAB — URINALYSIS, ROUTINE W REFLEX MICROSCOPIC
Bilirubin Urine: NEGATIVE
Glucose, UA: NEGATIVE
Hgb urine dipstick: NEGATIVE
Ketones, ur: NEGATIVE
Leukocytes,Ua: NEGATIVE
Nitrite: NEGATIVE
Protein, ur: NEGATIVE
Specific Gravity, Urine: 1.014 (ref 1.001–1.03)
pH: 8 (ref 5.0–8.0)

## 2020-07-20 ENCOUNTER — Encounter: Payer: Self-pay | Admitting: Internal Medicine

## 2020-07-20 LAB — HEPATITIS C ANTIBODY
Hepatitis C Ab: NONREACTIVE
SIGNAL TO CUT-OFF: 0.01 (ref <1.00)

## 2020-09-03 DIAGNOSIS — R5383 Other fatigue: Secondary | ICD-10-CM | POA: Diagnosis not present

## 2020-09-03 DIAGNOSIS — B349 Viral infection, unspecified: Secondary | ICD-10-CM | POA: Diagnosis not present

## 2020-09-03 DIAGNOSIS — Z20822 Contact with and (suspected) exposure to covid-19: Secondary | ICD-10-CM | POA: Diagnosis not present

## 2020-09-08 ENCOUNTER — Encounter: Payer: Self-pay | Admitting: Internal Medicine

## 2020-09-10 ENCOUNTER — Other Ambulatory Visit: Payer: Self-pay | Admitting: Internal Medicine

## 2020-09-10 DIAGNOSIS — E663 Overweight: Secondary | ICD-10-CM

## 2020-09-10 MED ORDER — PHENTERMINE HCL 37.5 MG PO TABS: 18.2500 mg | ORAL_TABLET | Freq: Every day | ORAL | 0 refills | Status: DC

## 2020-12-30 DIAGNOSIS — U071 COVID-19: Secondary | ICD-10-CM | POA: Diagnosis not present

## 2020-12-30 DIAGNOSIS — Z1152 Encounter for screening for COVID-19: Secondary | ICD-10-CM | POA: Diagnosis not present

## 2021-01-01 ENCOUNTER — Telehealth (INDEPENDENT_AMBULATORY_CARE_PROVIDER_SITE_OTHER): Payer: 59 | Admitting: Family Medicine

## 2021-01-01 ENCOUNTER — Encounter: Payer: Self-pay | Admitting: Internal Medicine

## 2021-01-01 ENCOUNTER — Other Ambulatory Visit: Payer: Self-pay

## 2021-01-01 ENCOUNTER — Encounter: Payer: Self-pay | Admitting: Family Medicine

## 2021-01-01 VITALS — Ht 62.0 in | Wt 160.0 lb

## 2021-01-01 DIAGNOSIS — U071 COVID-19: Secondary | ICD-10-CM

## 2021-01-01 MED ORDER — HYDROCOD POLST-CPM POLST ER 10-8 MG/5ML PO SUER: 5.0000 mL | Freq: Two times a day (BID) | ORAL | 0 refills | Status: DC | PRN

## 2021-01-01 MED ORDER — CETIRIZINE HCL 10 MG PO TABS: 10.0000 mg | ORAL_TABLET | Freq: Every day | ORAL | 0 refills | Status: DC

## 2021-01-01 MED ORDER — FLUTICASONE PROPIONATE 50 MCG/ACT NA SUSP: 2.0000 | Freq: Every day | NASAL | 0 refills | Status: DC

## 2021-01-01 NOTE — Progress Notes (Signed)
Virtual Visit via telephone Note  This visit type was conducted due to national recommendations for restrictions regarding the COVID-19 pandemic (e.g. social distancing).  This format is felt to be most appropriate for this patient at this time.  All issues noted in this document were discussed and addressed.  No physical exam was performed (except for noted visual exam findings with Video Visits).   I connected with Sarah West today at  3:15 PM EST by a video enabled telemedicine application or telephone and verified that I am speaking with the correct person using two identifiers. Location patient: home Location provider: work Persons participating in the virtual visit: patient, provider  I discussed the limitations, risks, security and privacy concerns of performing an evaluation and management service by telephone and the availability of in person appointments. I also discussed with the patient that there may be a patient responsible charge related to this service. The patient expressed understanding and agreed to proceed.  Interactive audio and video telecommunications were attempted between this provider and patient, however failed, due to patient having technical difficulties OR patient did not have access to video capability.  We continued and completed visit with audio only.   Reason for visit: same day  HPI: COVID19: Onset of symptoms on 1/10.  Felt like her typical allergy symptoms though progressed and she ended up getting tested on 1/12.  Cough-yes  Congestion-yes   Sinus-yes   Chest-no  Post nasal drip-yes  Sore throat-yes  Shortness of breath-no  Fever-100+ degrees  Taste disturbance-no  Smell disturbance-no  Covid exposure-unknown  Covid vaccination-yes  Covid booster-has not met criteria for booster vaccine yet  Medications-Delsym, throat lozenges, NyQuil, Mucinex DM all with no benefit, she has also been alternating 2   tablets of Tylenol and 2 tablets of  ibuprofen every 4-6 hours.    ROS: See pertinent positives and negatives per HPI.  Past Medical History:  Diagnosis Date  . Anxiety   . Asthma   . History of cervical dysplasia    s/p cryotherapy 8 years ago  . History of genital warts   . Hx of migraines    teenager   . Seizure ([place])    grand mal  . UTI (urinary tract infection)     Past Surgical History:  Procedure Laterality Date  . TONSILLECTOMY     2009  . WISDOM TOOTH EXTRACTION      Family History  Problem Relation Age of Onset  . Asthma Mother   . Breast cancer Maternal Grandmother   . Cancer Maternal Grandmother        breast  . Stroke Maternal Grandmother   . Heart disease Paternal Grandfather   . Cancer Maternal Aunt   . Cervical cancer Other     SOCIAL HX: Former smoker   Current Outpatient Medications:  .  ALPRAZolam (XANAX) 0.25 MG tablet, Take 1 tablet (0.25 mg total) by mouth daily as needed for anxiety., Disp: 30 tablet, Rfl: 2 .  cetirizine (ZYRTEC) 10 MG tablet, Take 1 tablet (10 mg total) by mouth daily., Disp: 30 tablet, Rfl: 0 .  chlorpheniramine-HYDROcodone (TUSSIONEX PENNKINETIC ER) 10-8 MG/5ML SUER, Take 5 mLs by mouth every 12 (twelve) hours as needed for cough., Disp: 140 mL, Rfl: 0 .  fluticasone (FLONASE) 50 MCG/ACT nasal spray, Place 2 sprays into both nostrils daily., Disp: 16 g, Rfl: 0 .  ibuprofen (ADVIL) 200 MG tablet, Take 200 mg by mouth every 6 (six) hours as needed., Disp: , Rfl:  .  phentermine (ADIPEX-P) 37.5 MG tablet, Take 0.5 tablets (18.75 mg total) by mouth daily before breakfast. (Patient not taking: Reported on 01/01/2021), Disp: 30 tablet, Rfl: 0  EXAM: This was a telephone visit and thus no physical exam was completed.  ASSESSMENT AND PLAN:  Discussed the following assessment and plan:  Problem List Items Addressed This Visit    COVID-19 - Primary    Patient with positive COVID test earlier this week and symptoms consistent with COVID-19.  Discussed supportive  care.  Advise she could use 600 mg of ibuprofen every 6 hours alternating with Tylenol 2 tablets over-the-counter.  We will prescribe Tussionex to use for her cough.  Discussed risk of drowsiness with this.  Advised against use of her Xanax while using the Tussidex.  She noted she rarely takes the Xanax.  We'll also treat with Flonase and Zyrtec.  Advised to remain quarantined until health at work releases her from quarantine.  She will seek medical attention if she develops shortness of breath, chest pain, or fevers of 103 F or higher.      Relevant Medications   chlorpheniramine-HYDROcodone (TUSSIONEX PENNKINETIC ER) 10-8 MG/5ML SUER   fluticasone (FLONASE) 50 MCG/ACT nasal spray   cetirizine (ZYRTEC) 10 MG tablet       I discussed the assessment and treatment plan with the patient. The patient was provided an opportunity to ask questions and all were answered. The patient agreed with the plan and demonstrated an understanding of the instructions.   The patient was advised to call back or seek an in-person evaluation if the symptoms worsen or if the condition fails to improve as anticipated.  I provided 11 minutes of non-face-to-face time during this encounter.   Tommi Rumps, MD

## 2021-01-01 NOTE — Assessment & Plan Note (Signed)
Patient with positive COVID test earlier this week and symptoms consistent with COVID-19.  Discussed supportive care.  Advise she could use 600 mg of ibuprofen every 6 hours alternating with Tylenol 2 tablets over-the-counter.  We will prescribe Tussionex to use for her cough.  Discussed risk of drowsiness with this.  Advised against use of her Xanax while using the Tussidex.  She noted she rarely takes the Xanax.  We'll also treat with Flonase and Zyrtec.  Advised to remain quarantined until health at work releases her from quarantine.  She will seek medical attention if she develops shortness of breath, chest pain, or fevers of 103 F or higher.

## 2021-01-14 ENCOUNTER — Encounter: Payer: Self-pay | Admitting: Internal Medicine

## 2021-01-19 ENCOUNTER — Ambulatory Visit (INDEPENDENT_AMBULATORY_CARE_PROVIDER_SITE_OTHER): Payer: 59 | Admitting: Podiatry

## 2021-01-19 ENCOUNTER — Other Ambulatory Visit: Payer: Self-pay

## 2021-01-19 ENCOUNTER — Ambulatory Visit (INDEPENDENT_AMBULATORY_CARE_PROVIDER_SITE_OTHER): Payer: 59

## 2021-01-19 DIAGNOSIS — M7751 Other enthesopathy of right foot: Secondary | ICD-10-CM

## 2021-01-19 MED ORDER — BETAMETHASONE SOD PHOS & ACET 6 (3-3) MG/ML IJ SUSP
3.0000 mg | Freq: Once | INTRAMUSCULAR | Status: AC
  Administered 2021-01-19: 3 mg via INTRA_ARTICULAR

## 2021-01-19 MED ORDER — IBUPROFEN 800 MG PO TABS
800.0000 mg | ORAL_TABLET | Freq: Three times a day (TID) | ORAL | 1 refills | Status: DC
Start: 2021-01-19 — End: 2023-01-26

## 2021-01-19 NOTE — Progress Notes (Signed)
   HPI: 35 y.o. female presenting today as a new patient for evaluation of pain and tenderness to the right forefoot this been going on approximately 6 or 7 months now.  Patient was seen approximately 8 months ago by another physician in [place], Dr. Elijah Birk, who gave her an injection and it did not help alleviate the pain long-term.  She states that she felt better for approximately 2 months however the pain slowly recurred.  She presents today for further treatment and evaluation  Past Medical History:  Diagnosis Date  . Anxiety   . Asthma   . History of cervical dysplasia    s/p cryotherapy 8 years ago  . History of genital warts   . Hx of migraines    teenager   . Seizure (HCC)    grand mal  . UTI (urinary tract infection)      Physical Exam: General: The patient is alert and oriented x3 in no acute distress.  Dermatology: Skin is warm, dry and supple bilateral lower extremities. Negative for open lesions or macerations.  Vascular: Palpable pedal pulses bilaterally. No edema or erythema noted. Capillary refill within normal limits.  Neurological: Epicritic and protective threshold grossly intact bilaterally.   Musculoskeletal Exam: Range of motion within normal limits to all pedal and ankle joints bilateral. Muscle strength 5/5 in all groups bilateral.  There is pain on palpation range of motion of the second MTPJ of the right foot  Radiographic Exam:  Normal osseous mineralization. Joint spaces preserved. No fracture/dislocation/boney destruction.    Assessment: 1.  Second MTPJ capsulitis right   Plan of Care:  1. Patient evaluated. X-Rays reviewed.  2.  Injection of 0.5 cc Celestone Soluspan injected in the second MTPJ right foot 3.  Prescription for Motrin 800 mg 3 times daily 4.  Patient has a history of GI upset from oral prednisone.  We will forego oral steroid Dosepaks at the moment 5.  Surgical shoe dispensed.  Weightbearing as tolerated x3 weeks 6.  Return to  clinic in 3 weeks.  At this time she is feeling better we will apply metatarsal pads to the insoles of the shoes  *Pharmacy tech at [facility] and in [facility]   Felecia Shelling, DPM [facility]  Dr. Felecia Shelling, DPM    [address] [place], [place] [address]                Office [phone]  Fax [phone]

## 2021-01-22 ENCOUNTER — Ambulatory Visit: Payer: Self-pay | Admitting: Podiatry

## 2021-01-24 ENCOUNTER — Other Ambulatory Visit: Payer: Self-pay | Admitting: Family Medicine

## 2021-01-24 DIAGNOSIS — U071 COVID-19: Secondary | ICD-10-CM

## 2021-02-04 ENCOUNTER — Other Ambulatory Visit: Payer: Self-pay | Admitting: Internal Medicine

## 2021-02-04 ENCOUNTER — Encounter: Payer: Self-pay | Admitting: Internal Medicine

## 2021-02-04 DIAGNOSIS — E663 Overweight: Secondary | ICD-10-CM

## 2021-02-04 MED ORDER — PHENTERMINE HCL 37.5 MG PO TABS: 18.2500 mg | ORAL_TABLET | Freq: Every day | ORAL | 0 refills | Status: DC

## 2021-02-09 ENCOUNTER — Ambulatory Visit (INDEPENDENT_AMBULATORY_CARE_PROVIDER_SITE_OTHER): Payer: 59 | Admitting: Podiatry

## 2021-02-09 ENCOUNTER — Encounter: Payer: Self-pay | Admitting: Podiatry

## 2021-02-09 ENCOUNTER — Other Ambulatory Visit: Payer: Self-pay

## 2021-02-09 DIAGNOSIS — M7751 Other enthesopathy of right foot: Secondary | ICD-10-CM | POA: Diagnosis not present

## 2021-02-09 NOTE — Progress Notes (Signed)
   HPI: 35 y.o. female presenting today for follow-up evaluation of pain and tenderness to the right forefoot this been going on approximately 6 or 7 months now.  Patient was seen approximately 8 months ago by another physician in [place], Dr. Elijah Birk, who gave her an injection and it did not help alleviate the pain long-term.  She states that she felt better for approximately 2 months however the pain slowly recurred.   Last visit patient received a steroidal anti-inflammatory injection on 01/19/2021 and she states that today she is feeling very well and no pain. She only wore the postsurgical shoe for 1 day. She presents today for further treatment and evaluation  Past Medical History:  Diagnosis Date  . Anxiety   . Asthma   . History of cervical dysplasia    s/p cryotherapy 8 years ago  . History of genital warts   . Hx of migraines    teenager   . Seizure (HCC)    grand mal  . UTI (urinary tract infection)      Physical Exam: General: The patient is alert and oriented x3 in no acute distress.  Dermatology: Skin is warm, dry and supple bilateral lower extremities. Negative for open lesions or macerations.  Vascular: Palpable pedal pulses bilaterally. No edema or erythema noted. Capillary refill within normal limits.  Neurological: Epicritic and protective threshold grossly intact bilaterally.   Musculoskeletal Exam: Range of motion within normal limits to all pedal and ankle joints bilateral. Muscle strength 5/5 in all groups bilateral.  There is negative for pain on palpation range of motion of the second MTPJ of the right foot   Assessment: 1.  Second MTPJ capsulitis right   Plan of Care:  1. Patient evaluated. 2. Overall the patient is feeling much better. Recommend good supportive shoes and sneakers and not going barefoot especially around the house that the patient admits that she does 3. Offloading felt metatarsal pads were applied to the insoles of the shoes to help  offload the second MTPJ and forefoot 4. Return to clinic as needed  *Pharmacy tech at [facility] and in [facility], DPM [facility]  Dr. Felecia Shelling, DPM    [address] [place], [place] [address]                Office [phone]  Fax [phone]

## 2021-03-13 ENCOUNTER — Encounter: Payer: Self-pay | Admitting: Internal Medicine

## 2021-03-15 ENCOUNTER — Other Ambulatory Visit: Payer: Self-pay | Admitting: Internal Medicine

## 2021-03-15 DIAGNOSIS — F411 Generalized anxiety disorder: Secondary | ICD-10-CM

## 2021-03-15 MED ORDER — ALPRAZOLAM 0.25 MG PO TABS: 0.2500 mg | ORAL_TABLET | Freq: Every day | ORAL | 2 refills | Status: DC | PRN

## 2021-07-20 ENCOUNTER — Ambulatory Visit (INDEPENDENT_AMBULATORY_CARE_PROVIDER_SITE_OTHER): Payer: 59 | Admitting: Internal Medicine

## 2021-07-20 ENCOUNTER — Other Ambulatory Visit: Payer: Self-pay

## 2021-07-20 ENCOUNTER — Encounter: Payer: Self-pay | Admitting: Internal Medicine

## 2021-07-20 VITALS — BP 98/70 | HR 81 | Temp 98.3°F | Ht 63.35 in | Wt 144.4 lb

## 2021-07-20 DIAGNOSIS — E538 Deficiency of other specified B group vitamins: Secondary | ICD-10-CM

## 2021-07-20 DIAGNOSIS — F411 Generalized anxiety disorder: Secondary | ICD-10-CM | POA: Diagnosis not present

## 2021-07-20 DIAGNOSIS — Z1329 Encounter for screening for other suspected endocrine disorder: Secondary | ICD-10-CM

## 2021-07-20 DIAGNOSIS — K2970 Gastritis, unspecified, without bleeding: Secondary | ICD-10-CM | POA: Diagnosis not present

## 2021-07-20 DIAGNOSIS — Z0000 Encounter for general adult medical examination without abnormal findings: Secondary | ICD-10-CM

## 2021-07-20 DIAGNOSIS — K2900 Acute gastritis without bleeding: Secondary | ICD-10-CM

## 2021-07-20 DIAGNOSIS — B9681 Helicobacter pylori [H. pylori] as the cause of diseases classified elsewhere: Secondary | ICD-10-CM

## 2021-07-20 DIAGNOSIS — Z1389 Encounter for screening for other disorder: Secondary | ICD-10-CM | POA: Diagnosis not present

## 2021-07-20 DIAGNOSIS — R1013 Epigastric pain: Secondary | ICD-10-CM

## 2021-07-20 DIAGNOSIS — Z0001 Encounter for general adult medical examination with abnormal findings: Secondary | ICD-10-CM | POA: Diagnosis not present

## 2021-07-20 DIAGNOSIS — E559 Vitamin D deficiency, unspecified: Secondary | ICD-10-CM | POA: Diagnosis not present

## 2021-07-20 LAB — LIPID PANEL
Cholesterol: 191 mg/dL (ref 0–200)
HDL: 80.8 mg/dL (ref >39.00)
LDL Cholesterol: 95 mg/dL (ref 0–99)
NonHDL: 109.9
Total CHOL/HDL Ratio: 2
Triglycerides: 76 mg/dL (ref 0.0–149.0)
VLDL: 15.2 mg/dL (ref 0.0–40.0)

## 2021-07-20 LAB — COMPREHENSIVE METABOLIC PANEL
ALT: 14 U/L (ref 0–35)
AST: 12 U/L (ref 0–37)
Albumin: 4.4 g/dL (ref 3.5–5.2)
Alkaline Phosphatase: 59 U/L (ref 39–117)
BUN: 8 mg/dL (ref 6–23)
CO2: 28 mEq/L (ref 19–32)
Calcium: 9.4 mg/dL (ref 8.4–10.5)
Chloride: 102 mEq/L (ref 96–112)
Creatinine, Ser: 0.66 mg/dL (ref 0.40–1.20)
GFR: 113.02 mL/min (ref >60.00)
Glucose, Bld: 85 mg/dL (ref 70–99)
Potassium: 3.9 mEq/L (ref 3.5–5.1)
Sodium: 138 mEq/L (ref 135–145)
Total Bilirubin: 0.7 mg/dL (ref 0.2–1.2)
Total Protein: 6.8 g/dL (ref 6.0–8.3)

## 2021-07-20 LAB — CBC WITH DIFFERENTIAL/PLATELET
Basophils Absolute: 0 10*3/uL (ref 0.0–0.1)
Basophils Relative: 0.6 % (ref 0.0–3.0)
Eosinophils Absolute: 0.2 10*3/uL (ref 0.0–0.7)
Eosinophils Relative: 4 % (ref 0.0–5.0)
HCT: 39.3 % (ref 36.0–46.0)
Hemoglobin: 12.8 g/dL (ref 12.0–15.0)
Lymphocytes Relative: 33.7 % (ref 12.0–46.0)
Lymphs Abs: 1.8 10*3/uL (ref 0.7–4.0)
MCHC: 32.7 g/dL (ref 30.0–36.0)
MCV: 92.9 fl (ref 78.0–100.0)
Monocytes Absolute: 0.3 10*3/uL (ref 0.1–1.0)
Monocytes Relative: 6.1 % (ref 3.0–12.0)
Neutro Abs: 2.9 10*3/uL (ref 1.4–7.7)
Neutrophils Relative %: 55.6 % (ref 43.0–77.0)
Platelets: 316 10*3/uL (ref 150.0–400.0)
RBC: 4.22 Mil/uL (ref 3.87–5.11)
RDW: 13.1 % (ref 11.5–15.5)
WBC: 5.2 10*3/uL (ref 4.0–10.5)

## 2021-07-20 LAB — TSH: TSH: 1.13 u[IU]/mL (ref 0.35–5.50)

## 2021-07-20 LAB — VITAMIN B12: Vitamin B-12: 592 pg/mL (ref 211–911)

## 2021-07-20 LAB — VITAMIN D 25 HYDROXY (VIT D DEFICIENCY, FRACTURES): VITD: 30.61 ng/mL (ref 30.00–100.00)

## 2021-07-20 MED ORDER — FAMOTIDINE 20 MG PO TABS: 20.0000 mg | ORAL_TABLET | Freq: Every day | ORAL | 0 refills | Status: DC

## 2021-07-20 MED ORDER — LORATADINE 10 MG PO TABS
10.0000 mg | ORAL_TABLET | Freq: Every day | ORAL | 3 refills | Status: DC | PRN
Start: 2021-07-20 — End: 2022-07-21

## 2021-07-20 MED ORDER — ALPRAZOLAM 0.25 MG PO TABS: 0.2500 mg | ORAL_TABLET | Freq: Every day | ORAL | 5 refills | Status: DC | PRN

## 2021-07-20 MED ORDER — ESOMEPRAZOLE MAGNESIUM 40 MG PO CPDR: 40.0000 mg | DELAYED_RELEASE_CAPSULE | Freq: Every day | ORAL | 0 refills | Status: DC

## 2021-07-20 NOTE — Progress Notes (Signed)
Chief Complaint  Patient presents with   Annual Exam   Annual 1. Epigastric sharp ab pain stopped adipex due to this a lot of stress in office food helps nothing tried  Will refer GI  2. Anxiety due to stress with neighbor and police called 3x    Review of Systems  Constitutional:  Negative for weight loss.  HENT:  Negative for hearing loss.   Eyes:  Negative for blurred vision.  Respiratory:  Negative for shortness of breath.   Cardiovascular:  Negative for chest pain.  Gastrointestinal:  Positive for abdominal pain.  Skin:  Negative for rash.  Past Medical History:  Diagnosis Date   Anxiety    Asthma    COVID-19    12/2020   History of cervical dysplasia    s/p cryotherapy 8 years ago   History of genital warts    Hx of migraines    teenager    Seizure (HCC)    grand mal   UTI (urinary tract infection)    Past Surgical History:  Procedure Laterality Date   TONSILLECTOMY     2009   WISDOM TOOTH EXTRACTION     Family History  Problem Relation Age of Onset   Asthma Mother    Breast cancer Maternal Grandmother    Cancer Maternal Grandmother        breast   Stroke Maternal Grandmother    Heart disease Paternal Grandfather    Cancer Maternal Aunt    Cervical cancer Other    Social History   Socioeconomic History   Marital status: Married    Spouse name: Not on file   Number of children: Not on file   Years of education: Not on file   Highest education level: Not on file  Occupational History   Not on file  Tobacco Use   Smoking status: Former    Packs/day: 0.25    Types: Cigarettes   Smokeless tobacco: Never  Vaping Use   Vaping Use: Never used  Substance and Sexual Activity   Alcohol use: Yes    Alcohol/week: 2.0 standard drinks    Types: 2 Glasses of wine per week   Drug use: No   Sexual activity: Yes    Birth control/protection: Surgical, Other-see comments    Comment: vastormy  Other Topics Concern   Not on file  Social History Narrative    Married since 2010    No kids    Works at [employer]    No guns    Wears seat belt    Safe in relationship    Social Determinants of Corporate investment banker Strain: Not on file  Food Insecurity: Not on file  Transportation Needs: Not on file  Physical Activity: Not on file  Stress: Not on file  Social Connections: Not on file  Intimate Partner Violence: Not on file   Current Meds  Medication Sig   esomeprazole (NEXIUM) 40 MG capsule Take 1 capsule (40 mg total) by mouth daily. Before food 30 min   famotidine (PEPCID) 20 MG tablet Take 1 tablet (20 mg total) by mouth daily. 30 min before food   fluticasone (FLONASE) 50 MCG/ACT nasal spray SPRAY 2 SPRAYS INTO EACH NOSTRIL EVERY DAY   ibuprofen (ADVIL) 800 MG tablet Take 1 tablet (800 mg total) by mouth 3 (three) times daily.   loratadine (CLARITIN) 10 MG tablet Take 1 tablet (10 mg total) by mouth daily as needed.   [DISCONTINUED] ALPRAZolam (XANAX) 0.25 MG  tablet Take 1 tablet (0.25 mg total) by mouth daily as needed for anxiety.   [DISCONTINUED] Loratadine (CLARITIN PO) Take by mouth.   No Known Allergies No results found for this or any previous visit (from the past 2160 hour(s)). Objective  Body mass index is 25.3 kg/m. Wt Readings from Last 3 Encounters:  07/20/21 144 lb 6.4 oz (65.5 kg)  01/01/21 160 lb (72.6 kg)  07/17/20 160 lb 3.2 oz (72.7 kg)   Temp Readings from Last 3 Encounters:  07/20/21 98.3 F (36.8 C) (Oral)  07/17/20 98.5 F (36.9 C) (Oral)  03/22/20 97.6 F (36.4 C) (Oral)   BP Readings from Last 3 Encounters:  07/20/21 98/70  07/17/20 112/78  06/02/20 111/77   Pulse Readings from Last 3 Encounters:  07/20/21 81  07/17/20 76  06/02/20 90    Physical Exam Vitals and nursing note reviewed.  Constitutional:      Appearance: Normal appearance. She is well-developed and well-groomed.  HENT:     Head: Normocephalic and atraumatic.  Eyes:     Conjunctiva/sclera: Conjunctivae normal.      Pupils: Pupils are equal, round, and reactive to light.  Cardiovascular:     Rate and Rhythm: Normal rate and regular rhythm.     Heart sounds: Normal heart sounds. No murmur heard. Pulmonary:     Effort: Pulmonary effort is normal.     Breath sounds: Normal breath sounds.  Abdominal:     Tenderness: There is no abdominal tenderness.  Skin:    General: Skin is warm and dry.  Neurological:     General: No focal deficit present.     Mental Status: She is alert and oriented to person, place, and time. Mental status is at baseline.     Gait: Gait normal.  Psychiatric:        Attention and Perception: Attention and perception normal.        Mood and Affect: Mood and affect normal.        Speech: Speech normal.        Behavior: Behavior normal. Behavior is cooperative.        Thought Content: Thought content normal.        Cognition and Memory: Cognition and memory normal.        Judgment: Judgment normal.    Assessment  Plan  Annual physical exam - Plan: Comprehensive metabolic panel, Lipid panel, CBC with Differential/Platelet  Epigastric pain Refer leb GI   Vitamin D deficiency - Plan: Vitamin D (25 hydroxy)  B12 deficiency - Plan: Vitamin B12  Gastritis, Helicobacter pylori - Plan: H Pylori, IGM, IGG, IGA AB, famotidine (PEPCID) 20 MG tablet, esomeprazole (NEXIUM) 40 MG capsule  GAD (generalized anxiety disorder) - Plan: ALPRAZolam (XANAX) 0.25 MG tablet bid call [facility]  HM Fasting labs today  Had flu shot fall 2018 and 2019 with [facility] covid 2/2 moderna declines further  Declines HIV   Tdap had 08/19/16 Declines STD testing Pap 05/13/20 neg neg HPV rec smoking cessation 4-5 cig/qd on and off since age 66 y.o  rec take D3 2000 IU qd otc rec healthy diet and exercise    Provider: Dr. French Ana McLean-Scocuzza-Internal Medicine

## 2021-07-20 NOTE — Patient Instructions (Addendum)
Consider pepcid and prilosec or nexium over the counter   E[facility] [phone]

## 2021-07-21 LAB — URINALYSIS, ROUTINE W REFLEX MICROSCOPIC
Bilirubin Urine: NEGATIVE
Glucose, UA: NEGATIVE
Hgb urine dipstick: NEGATIVE
Ketones, ur: NEGATIVE
Leukocytes,Ua: NEGATIVE
Nitrite: NEGATIVE
Protein, ur: NEGATIVE
Specific Gravity, Urine: 1.004 (ref 1.001–1.035)
pH: 8 (ref 5.0–8.0)

## 2021-07-22 LAB — H PYLORI, IGM, IGG, IGA AB
H pylori, IgM Abs: <9 units (ref 0.0–8.9)
H. pylori, IgA Abs: <9 units (ref 0.0–8.9)
H. pylori, IgG AbS: 0.16 Index Value (ref 0.00–0.79)

## 2021-08-04 ENCOUNTER — Other Ambulatory Visit: Payer: Self-pay | Admitting: Internal Medicine

## 2021-08-04 DIAGNOSIS — F411 Generalized anxiety disorder: Secondary | ICD-10-CM

## 2021-08-06 ENCOUNTER — Encounter: Payer: Self-pay | Admitting: Internal Medicine

## 2021-08-10 ENCOUNTER — Encounter: Payer: Self-pay | Admitting: Gastroenterology

## 2021-08-15 ENCOUNTER — Other Ambulatory Visit: Payer: Self-pay | Admitting: Internal Medicine

## 2021-08-15 DIAGNOSIS — B9681 Helicobacter pylori [H. pylori] as the cause of diseases classified elsewhere: Secondary | ICD-10-CM

## 2021-08-15 DIAGNOSIS — K2900 Acute gastritis without bleeding: Secondary | ICD-10-CM

## 2021-08-16 ENCOUNTER — Other Ambulatory Visit: Payer: Self-pay | Admitting: Family Medicine

## 2021-08-16 DIAGNOSIS — U071 COVID-19: Secondary | ICD-10-CM

## 2021-08-17 ENCOUNTER — Other Ambulatory Visit: Payer: Self-pay | Admitting: Internal Medicine

## 2021-08-17 DIAGNOSIS — B9681 Helicobacter pylori [H. pylori] as the cause of diseases classified elsewhere: Secondary | ICD-10-CM

## 2021-08-17 DIAGNOSIS — K2900 Acute gastritis without bleeding: Secondary | ICD-10-CM

## 2021-08-17 MED ORDER — FAMOTIDINE 20 MG PO TABS: 20.0000 mg | ORAL_TABLET | Freq: Every day | ORAL | 1 refills | Status: DC

## 2021-09-01 ENCOUNTER — Encounter: Payer: Self-pay | Admitting: Gastroenterology

## 2021-09-01 ENCOUNTER — Ambulatory Visit (INDEPENDENT_AMBULATORY_CARE_PROVIDER_SITE_OTHER): Payer: 59 | Admitting: Gastroenterology

## 2021-09-01 VITALS — BP 118/82 | HR 90 | Ht 62.5 in | Wt 146.0 lb

## 2021-09-01 DIAGNOSIS — R1013 Epigastric pain: Secondary | ICD-10-CM | POA: Diagnosis not present

## 2021-09-01 NOTE — Progress Notes (Signed)
HPI : Sarah West is a pleasant 35 year old female referred by Dr. French Ana McLean-Scocuzza for further evaluation of epigastric pain.  The patient states that she first started having this pain about a year ago, but it has progressed in frequency and severity since then.  She describes the pain as dull in character, consistently located in the mid upper abdomen and moderate in severity, often a 6-7 out of 10.  She experiences the pain most days.  It is sometimes associated with nausea, but no vomiting.  She denies any GERD symptoms other than excessive belching.  The pain does not radiate.  It is not worsened with eating, and actually seems to get better sometimes with food.  The pain does bother her at night. She has been taking Pepcid in the mornings and Nexium before dinner.  Initially she said these medications helped, but did not seem to help much any longer. She lost 20 pounds intentionally over the course of this year with the help of phentermine and improve diet and exercise.  She stopped taking phentermine earlier this year because she thought it might be contributing to her abdominal pain. She reports regular bowel movements usually 3/day which is her norm.  Her stools are often loose or poorly formed.  She takes Ibuprofen about twice a week on average for foot pain and headaches.  She usually takes 800 mg when she takes it. When she saw her PCM last month, she was found to have a normal CBC, CMP and negative H. pylori serologies.   Past Medical History:  Diagnosis Date   Anxiety    Asthma    COVID-19    12/2020   History of cervical dysplasia    s/p cryotherapy 8 years ago   History of genital warts    Hx of migraines    teenager    Seizure (HCC)    grand mal   UTI (urinary tract infection)      Past Surgical History:  Procedure Laterality Date   TONSILLECTOMY     2009   WISDOM TOOTH EXTRACTION     Family History  Problem Relation Age of Onset   Asthma Mother     Breast cancer Maternal Grandmother    Cancer Maternal Grandmother        breast   Stroke Maternal Grandmother    Heart disease Paternal Grandfather    Cancer Maternal Aunt    Cervical cancer Other    Social History   Tobacco Use   Smoking status: Former    Packs/day: 0.25    Types: Cigarettes   Smokeless tobacco: Never  Vaping Use   Vaping Use: Never used  Substance Use Topics   Alcohol use: Yes    Alcohol/week: 2.0 standard drinks    Types: 2 Glasses of wine per week   Drug use: No   Current Outpatient Medications  Medication Sig Dispense Refill   ALPRAZolam (XANAX) 0.25 MG tablet Take 1 tablet (0.25 mg total) by mouth daily as needed for anxiety. 30 tablet 5   esomeprazole (NEXIUM) 40 MG capsule Take 1 capsule (40 mg total) by mouth daily. Before food 30 min 30 capsule 0   famotidine (PEPCID) 20 MG tablet Take 1 tablet (20 mg total) by mouth daily. 30 min before food 30 tablet 1   famotidine (PEPCID) 20 MG tablet TAKE 1 TABLET (20 MG TOTAL) BY MOUTH DAILY. 30 MIN BEFORE FOOD 30 tablet 0   fluticasone (FLONASE) 50 MCG/ACT nasal spray  SPRAY 2 SPRAYS INTO EACH NOSTRIL EVERY DAY 16 mL 1   ibuprofen (ADVIL) 800 MG tablet Take 1 tablet (800 mg total) by mouth 3 (three) times daily. 90 tablet 1   loratadine (CLARITIN) 10 MG tablet Take 1 tablet (10 mg total) by mouth daily as needed. 90 tablet 3   phentermine (ADIPEX-P) 37.5 MG tablet Take 0.5 tablets (18.75 mg total) by mouth daily before breakfast. (Patient not taking: Reported on 07/20/2021) 30 tablet 0   No current facility-administered medications for this visit.   No Known Allergies   Review of Systems: All systems reviewed and negative except where noted in HPI.    No results found.  Physical Exam: Vitals reviewed Constitutional: Pleasant,well-developed, Caucasian female in no acute distress. HEENT: Normocephalic and atraumatic. Conjunctivae are normal. No scleral icterus. MP1 Neck supple.  Cardiovascular: Normal  rate, regular rhythm.  Pulmonary/chest: Effort normal and breath sounds normal. No wheezing, rales or rhonchi. Abdominal: Soft, nondistended, nontender. Bowel sounds active throughout. There are no masses palpable. No hepatomegaly. Extremities: no edema Lymphadenopathy: No cervical adenopathy noted. Neurological: Alert and oriented to person place and time. Skin: Skin is warm and dry. No rashes noted. Psychiatric: Normal mood and affect. Behavior is normal.  CBC    Component Value Date/Time   WBC 5.2 07/20/2021 1035   RBC 4.22 07/20/2021 1035   HGB 12.8 07/20/2021 1035   HGB 13.0 05/13/2020 1445   HCT 39.3 07/20/2021 1035   HCT 39.0 05/13/2020 1445   PLT 316.0 07/20/2021 1035   PLT 345 05/13/2020 1445   MCV 92.9 07/20/2021 1035   MCV 93 05/13/2020 1445   MCH 31.1 05/13/2020 1445   MCHC 32.7 07/20/2021 1035   RDW 13.1 07/20/2021 1035   RDW 12.1 05/13/2020 1445   LYMPHSABS 1.8 07/20/2021 1035   MONOABS 0.3 07/20/2021 1035   EOSABS 0.2 07/20/2021 1035   BASOSABS 0.0 07/20/2021 1035    CMP     Component Value Date/Time   NA 138 07/20/2021 1035   NA 136 07/04/2017 0948   K 3.9 07/20/2021 1035   CL 102 07/20/2021 1035   CO2 28 07/20/2021 1035   GLUCOSE 85 07/20/2021 1035   BUN 8 07/20/2021 1035   BUN 11 07/04/2017 0948   CREATININE 0.66 07/20/2021 1035   CALCIUM 9.4 07/20/2021 1035   PROT 6.8 07/20/2021 1035   PROT 7.3 07/04/2017 0948   ALBUMIN 4.4 07/20/2021 1035   ALBUMIN 4.8 07/04/2017 0948   AST 12 07/20/2021 1035   ALT 14 07/20/2021 1035   ALKPHOS 59 07/20/2021 1035   BILITOT 0.7 07/20/2021 1035   BILITOT 0.5 07/04/2017 0948   GFRNONAA 119 07/04/2017 0948   GFRAA 137 07/04/2017 0948     ASSESSMENT AND PLAN: 35 year old female with history of anxiety with a 1 year history of persistent epigastric pain without red flag symptoms.  She admits that she is dealing with significant stress related to a bad relationship with her neighbor.  I told her that I suspect  that her abdominal pain is most likely related to her stress and anxiety, but it is reasonable to perform an upper endoscopy to exclude other etiologies such as peptic ulcer disease.  She does take NSAIDs on occasion.  I offered Bentyl as another possible medication to help her deal with her episodes of pain, but she says she has taken this in the past, and it caused her significant constipation.  I then recommended to trial of IBgard, and she was given a  few samples from our clinic.  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  The details, risks (including bleeding, perforation, infection, missed lesions, medication reactions and possible hospitalization or surgery if complications occur), benefits, and alternatives to EGD with possible biopsy and possible dilation were discussed with the patient and she consents to proceed.    Nyiah Pianka E. Tomasa Rand, MD [facility]  CC:  McLean-Scocuzza, French Ana *

## 2021-09-01 NOTE — Patient Instructions (Signed)
If you are age 36 or older, your body mass index should be between 23-30. Your Body mass index is 26.28 kg/m. If this is out of the aforementioned range listed, please consider follow up with your Primary Care Provider.  If you are age 14 or younger, your body mass index should be between 19-25. Your Body mass index is 26.28 kg/m. If this is out of the aformentioned range listed, please consider follow up with your Primary Care Provider.   You have been scheduled for an endoscopy. Please follow written instructions given to you at your visit today. If you use inhalers (even only as needed), please bring them with you on the day of your procedure.   Please continue to hold Phentermine until after your procedure.  The [place] [facility] providers would like to encourage you to use [facility] to communicate with providers for non-urgent requests or questions.  Due to long hold times on the telephone, sending your provider a message by [facility] may be a faster and more efficient way to get a response.  Please allow 48 business hours for a response.  Please remember that this is for non-urgent requests.   It was a pleasure to see you today!  Thank you for trusting me with your gastrointestinal care!    Scott E. Tomasa Rand

## 2021-09-06 ENCOUNTER — Other Ambulatory Visit: Payer: Self-pay

## 2021-09-06 ENCOUNTER — Encounter: Payer: Self-pay | Admitting: Gastroenterology

## 2021-09-06 ENCOUNTER — Ambulatory Visit (AMBULATORY_SURGERY_CENTER): Payer: 59 | Admitting: Gastroenterology

## 2021-09-06 VITALS — BP 117/74 | HR 83 | Temp 99.3°F | Resp 14 | Ht 62.5 in | Wt 146.0 lb

## 2021-09-06 DIAGNOSIS — R109 Unspecified abdominal pain: Secondary | ICD-10-CM | POA: Diagnosis not present

## 2021-09-06 DIAGNOSIS — R1013 Epigastric pain: Secondary | ICD-10-CM | POA: Diagnosis not present

## 2021-09-06 MED ORDER — SODIUM CHLORIDE 0.9 % IV SOLN: 500.0000 mL | Freq: Once | INTRAVENOUS | Status: DC

## 2021-09-06 NOTE — Progress Notes (Signed)
Pt's states no medical or surgical changes since previsit or office visit. VS assessed by N.C ?

## 2021-09-06 NOTE — Progress Notes (Signed)
Called to room to assist during endoscopic procedure.  Patient ID and intended procedure confirmed with present staff. Received instructions for my participation in the procedure from the performing physician.  

## 2021-09-06 NOTE — Progress Notes (Signed)
History and Physical Interval Note:  No changes in patient's symptoms or medical history since her clinic visit on Sept  14th.  Last dose of ibuprofen was 2 days ago.  09/06/2021 2:56 PM  Sarah West  has presented today for endoscopic procedure(s), with the diagnosis of  Encounter Diagnosis  Name Primary?   Abdominal pain, epigastric Yes  .  The various methods of evaluation and treatment have been discussed with the patient and/or family. After consideration of risks, benefits and other options for treatment, the patient has consented to  the endoscopic procedure(s).   The patient's history has been reviewed, patient examined, no change in status, stable for endoscopic procedure(s).  I have reviewed the patient's chart and labs.  Questions were answered to the patient's satisfaction.     Marvalene Barrett E. Tomasa Rand, MD [facility]

## 2021-09-06 NOTE — Patient Instructions (Signed)
Await pathology results. Resume regular diet and continue current medications.  Follow up in clinic with Dr. Tomasa Rand as needed.    YOU HAD AN ENDOSCOPIC PROCEDURE TODAY AT THE [facility]:   Refer to the procedure report that was given to you for any specific questions about what was found during the examination.  If the procedure report does not answer your questions, please call your gastroenterologist to clarify.  If you requested that your care partner not be given the details of your procedure findings, then the procedure report has been included in a sealed envelope for you to review at your convenience later.  YOU SHOULD EXPECT: Some feelings of bloating in the abdomen. Passage of more gas than usual.  Walking can help get rid of the air that was put into your GI tract during the procedure and reduce the bloating. If you had a lower endoscopy (such as a colonoscopy or flexible sigmoidoscopy) you may notice spotting of blood in your stool or on the toilet paper. If you underwent a bowel prep for your procedure, you may not have a normal bowel movement for a few days.  Please Note:  You might notice some irritation and congestion in your nose or some drainage.  This is from the oxygen used during your procedure.  There is no need for concern and it should clear up in a day or so.  SYMPTOMS TO REPORT IMMEDIATELY:  Following upper endoscopy (EGD)  Vomiting of blood or coffee ground material  New chest pain or pain under the shoulder blades  Painful or persistently difficult swallowing  New shortness of breath  Fever of 100F or higher  Black, tarry-looking stools  For urgent or emergent issues, a gastroenterologist can be reached at any hour by calling [phone]. Do not use MyChart messaging for urgent concerns.    DIET:  We do recommend a small meal at first, but then you may proceed to your regular diet.  Drink plenty of fluids but you should avoid alcoholic  beverages for 24 hours.  ACTIVITY:  You should plan to take it easy for the rest of today and you should NOT DRIVE or use heavy machinery until tomorrow (because of the sedation medicines used during the test).    FOLLOW UP: Our staff will call the number listed on your records 48-72 hours following your procedure to check on you and address any questions or concerns that you may have regarding the information given to you following your procedure. If we do not reach you, we will leave a message.  We will attempt to reach you two times.  During this call, we will ask if you have developed any symptoms of COVID 19. If you develop any symptoms (ie: fever, flu-like symptoms, shortness of breath, cough etc.) before then, please call [phone].  If you test positive for Covid 19 in the 2 weeks post procedure, please call and report this information to [place].    If any biopsies were taken you will be contacted by phone or by letter within the next 1-3 weeks.  Please call us at [phone] if you have not heard about the biopsies in 3 weeks.    SIGNATURES/CONFIDENTIALITY: You and/or your care partner have signed paperwork which will be entered into your electronic medical record.  These signatures attest to the fact that that the information above on your After Visit Summary has been reviewed and is understood.  Full responsibility of the confidentiality of this  discharge information lies with you and/or your care-partner.  

## 2021-09-06 NOTE — Op Note (Signed)
[facility] Patient Name: Sarah West Procedure Date: 09/06/2021 2:55 PM MRN: [id number] Endoscopist: Lorin Picket E. Tomasa Rand , MD Age: 35 Referring MD:  Date of Birth: 1986-04-20 Gender: Female Account #: [id number] Procedure:                Upper GI endoscopy Indications:              Epigastric abdominal pain Medicines:                Monitored Anesthesia Care Procedure:                Pre-Anesthesia Assessment:                           - Prior to the procedure, a History and Physical                            was performed, and patient medications and                            allergies were reviewed. The patient's tolerance of                            previous anesthesia was also reviewed. The risks                            and benefits of the procedure and the sedation                            options and risks were discussed with the patient.                            All questions were answered, and informed consent                            was obtained. Prior Anticoagulants: The patient has                            taken no previous anticoagulant or antiplatelet                            agents. ASA Grade Assessment: II - A patient with                            mild systemic disease. After reviewing the risks                            and benefits, the patient was deemed in                            satisfactory condition to undergo the procedure.                           After obtaining informed consent, the endoscope was  passed under direct vision. Throughout the                            procedure, the patient's blood pressure, pulse, and                            oxygen saturations were monitored continuously. The                            GIF [id number] #[id number] was introduced through the                            mouth, and advanced to the third part of duodenum.                            The upper GI endoscopy  was accomplished without                            difficulty. The patient tolerated the procedure but                            patient was difficult to sedate and required high                            doses of propofol in addition. Scope In: Scope Out: Findings:                 The examined portions of the nasopharynx,                            oropharynx and larynx were normal.                           The examined esophagus was normal.                           The entire examined stomach was normal. Biopsies                            were taken with a cold forceps for Helicobacter                            pylori testing. Estimated blood loss was minimal.                           The examined duodenum was normal. Complications:            No immediate complications. Estimated Blood Loss:     Estimated blood loss was minimal. Impression:               - The examined portions of the nasopharynx,                            oropharynx and larynx were normal.                           -  Normal esophagus.                           - Normal stomach. Biopsied.                           - Normal examined duodenum.                           - No endoscopic abnormalities to explain patient's                            abdominal pain. Recommendation:           - Patient has a contact number available for                            emergencies. The signs and symptoms of potential                            delayed complications were discussed with the                            patient. Return to normal activities tomorrow.                            Written discharge instructions were provided to the                            patient.                           - Resume previous diet.                           - Continue present medications.                           - Await pathology results.                           - Follow up as needed in clinic with Dr. Tiajuana Amass E.  Tomasa Rand, MD 09/06/2021 3:36:47 PM This report has been signed electronically.

## 2021-09-06 NOTE — Progress Notes (Signed)
Pt difficult to sedate  Requiring IV Versed and Diprivan Pt requiring assistance to manage airway retape IV an administer meds. VVS throughout procedure  Suctioned with emergence    Procedure performed w/ assistance  Transferred to PACU Stable talking  Report to RN

## 2021-09-08 ENCOUNTER — Telehealth: Payer: Self-pay | Admitting: *Deleted

## 2021-09-08 NOTE — Telephone Encounter (Signed)
See phone notes from 09/08/21 for details.

## 2021-09-08 NOTE — Telephone Encounter (Signed)
  Follow up Call-  Call back number 09/06/2021  Post procedure Call Back phone  # [phone]  Permission to leave phone message Yes  Some recent data might be hidden     Patient questions:  Do you have a fever, pain , or abdominal swelling? No. Pain Score  0 *  Have you tolerated food without any problems? Yes.    Have you been able to return to your normal activities? Yes.    Do you have any questions about your discharge instructions: Diet   No. Medications  No. Follow up visit  No.  Do you have questions or concerns about your Care? No.  Actions: * If pain score is 4 or above: No action needed, pain <4.  Have you developed a fever since your procedure? no  2.   Have you had an respiratory symptoms (SOB or cough) since your procedure? no  3.   Have you tested positive for COVID 19 since your procedure no  4.   Have you had any family members/close contacts diagnosed with the COVID 19 since your procedure?  no   If yes to any of these questions please route to Laverna Peace, RN and Karlton Lemon, RN

## 2021-09-29 ENCOUNTER — Encounter: Payer: Self-pay | Admitting: Gastroenterology

## 2021-10-21 ENCOUNTER — Other Ambulatory Visit: Payer: Self-pay | Admitting: Internal Medicine

## 2021-10-21 DIAGNOSIS — K2900 Acute gastritis without bleeding: Secondary | ICD-10-CM

## 2021-10-21 DIAGNOSIS — B9681 Helicobacter pylori [H. pylori] as the cause of diseases classified elsewhere: Secondary | ICD-10-CM

## 2022-01-21 ENCOUNTER — Other Ambulatory Visit: Payer: Self-pay | Admitting: Internal Medicine

## 2022-01-21 DIAGNOSIS — F411 Generalized anxiety disorder: Secondary | ICD-10-CM

## 2022-01-21 NOTE — Telephone Encounter (Signed)
Refilled: 07/20/2021 Last OV: 07/20/2021 Next OV: 07/21/2022

## 2022-02-15 ENCOUNTER — Encounter: Payer: Self-pay | Admitting: Internal Medicine

## 2022-02-15 ENCOUNTER — Telehealth: Payer: Self-pay | Admitting: Internal Medicine

## 2022-02-15 ENCOUNTER — Telehealth (INDEPENDENT_AMBULATORY_CARE_PROVIDER_SITE_OTHER): Payer: BLUE CROSS/BLUE SHIELD | Admitting: Internal Medicine

## 2022-02-15 ENCOUNTER — Other Ambulatory Visit: Payer: Self-pay

## 2022-02-15 VITALS — BP 114/74 | Ht 62.5 in | Wt 150.0 lb

## 2022-02-15 DIAGNOSIS — F419 Anxiety disorder, unspecified: Secondary | ICD-10-CM

## 2022-02-15 DIAGNOSIS — R41840 Attention and concentration deficit: Secondary | ICD-10-CM | POA: Diagnosis not present

## 2022-02-15 NOTE — Progress Notes (Signed)
Patient recently started job working from home. States she gets distracted a lot and spaces out a lot.

## 2022-02-15 NOTE — Patient Instructions (Signed)
Supporting Someone With Attention Deficit Hyperactivity Disorder Attention deficit hyperactivity disorder (ADHD) is a behavior problem that is present in a person due to the way that his or her brain functions (neurobehavioral disorder). It is a common cause of behavioral and learning (academic) problems among children. ADHD is a long-term (chronic) condition. If this disorder is not treated, it can have serious effects into adolescence and adulthood. When a person has ADHD, his or her condition can affect others around him or her, such as friends and family members. Friends and family can help by offering support and understanding. What do I need to know about this condition? ADHD can affect daily functioning in ways that often cause problems for the person with ADHD and his or her friends and family members. A child with ADHD may: Have a poor attention span. This means that he or she can only stay focused or interested in something for a short time. Get distracted easily. Have trouble listening to instructions. Daydream. Make careless mistakes. Be forgetful. Talk too much, such as blurting out answers to questions. Have trouble sitting still for long. Fidget or get out of his or her seat during class. An adult with ADHD may: Get distracted easily. Be disorganized at home and work. Miss, forget, or be late for appointments. Have trouble with details. Have trouble completing tasks. Be irritable and impatient. Get bored easily during meetings. Have great difficulty concentrating. What do I need to know about the treatment options? Treatment for this condition usually involves: Behavioral treatment. Working with a Paramedic, the person with ADHD may: Set rewards for desired behavior. Set small goals and clear expectations, and be held accountable for meeting them. Get help with planning and timing activities. Become more patient and more mindful of the condition. Medicines, such  as: Stimulant medicines that help a person to: Control his or her behavior (decrease impulsivity). Control his or her extra physical activity (decrease hyperactivity). Increase his or her ability to pay attention. Antidepressants. Certain blood pressure medicines. Structured classroom management for children at school, such as tutoring or extra support in classes. Techniques for parents to use at home to help manage their child's symptoms and behavior. These include rewarding good behavior, providing consistent discipline, and setting limits. How can I support my loved one? Talk about the condition Pick a time to talk with your loved one when distractions and interruptions are unlikely. Let your loved one know that he or she is capable of success. Focus on your loved one's strengths, and try to not let your loved one use ADHD as an excuse for undesirable behavior. Let your loved one know that there are well-known, successful people who also have ADHD. This may be encouraging to your loved one. Give your loved one time to process his or her thoughts and to ask questions. Children with ADHD may benefit from hearing more about how their treatment plan will help them. This may help them focus on goal behaviors. Find support and resources A health care provider may be able to recommend resources that are available online or over the phone. You could start with: Attention Deficit Disorder Association (ADDA): [employer] Mental [employer] [facility]): MarathonMeals.com.cy.shtml Training classes or conferences that help parents of children with ADHD to support their children and cope with the disorder. Support groups for families who are affected by ADHD. General support If you are a parent of a child with ADHD, you can take the following actions to support your child's education:  Talk to teachers about the ways  that your child learns best. Be your child's advocate and stay in touch with his or her school about all problems related to ADHD. At the end of the summer, make appointments to talk with teachers and other school staff before the new school year begins. Listen to teachers carefully, and share your child's history with them. Create a behavior plan that your child, your family, and the teachers can agree on. Write down goals to help your child succeed. How should I care for myself? It is important to find ways to care for your body, mind, and well-being while supporting someone with ADHD. Spend time with friends and family. Find someone you can talk to who will also help you work on using coping skills to manage stress. Understand what your limits are. Say "no" to requests or events that lead to a schedule that is too busy. Make time for activities that help you relax, and try to not feel guilty about taking time for yourself. Consider trying meditation and deep breathing exercises to lower your stress. Get plenty of sleep. Exercise, even if it is just taking a short walk a few times a week. If you are a parent of a child with ADHD, arrange for child care so you can take breaks once in a while. What are some signs that the condition is getting worse? Signs that your loved one's condition may be getting worse include: Increased trouble completing tasks and paying attention. Hyperactivity and impulsivity. Problems with relationships. Impatience, restlessness, and mood swings. Worsening problems at school, if applicable. Contact a health care provider if: Your loved one's symptoms get worse. Your loved one shows signs of depression, anxiety, or another mental health condition. Your child has behavioral problems at school. Summary Attention deficit hyperactivity disorder (ADHD) is a long-term (chronic) condition that can affect daily functioning in ways that often cause problems for the person  with ADHD and his or her loved ones. This disorder can be treated effectively with medicine, behavioral treatment, and techniques to manage symptoms and behaviors. Many organizations and groups are available to help families to manage ADHD. The support people in the life of someone with ADHD play an extremely important role in helping that person develop healthy behaviors to live a satisfying life. It is important to find ways to care for your own body, mind, and well-being while supporting someone with ADHD. Make time for activities that help you relax. This information is not intended to replace advice given to you by your health care provider. Make sure you discuss any questions you have with your health care provider. Document Revised: 05/20/2020 Document Reviewed: 05/20/2020 Elsevier Patient Education  2022 [employer].  Living With Attention Deficit Hyperactivity Disorder If you have been diagnosed with attention deficit hyperactivity disorder (ADHD), you may be relieved that you now know why you have felt or behaved a certain way. Still, you may feel overwhelmed about the treatment ahead. You may also wonder how to get the support you need and how to deal with the condition day-to-day. With treatment and support, you can live with ADHD and manage your symptoms. How to manage lifestyle changes Managing stress Stress is your body's reaction to life changes and events, both good and bad. To cope with the stress of an ADHD diagnosis, it may help to: Learn more about ADHD. Exercise regularly. Even a short daily walk can lower stress levels. Participate in training or education programs (including social skills  training classes) that teach you to deal with symptoms.  Medicines Your health care provider may suggest certain medicines if he or she feels that they will help to improve your condition. Stimulant medicines are usually prescribed to treat ADHD, and therapy may also be prescribed. It is  important to: Avoid using alcohol and other substances that may prevent your medicines from working properly (may interact). Talk with your pharmacist or health care provider about all the medicines that you take, their possible side effects, and what medicines are safe to take together. Make it your goal to take part in all treatment decisions (shared decision-making). Ask about possible side effects of medicines that your health care provider recommends, and tell him or her how you feel about having those side effects. It is best if shared decision-making with your health care provider is part of your total treatment plan. Relationships To strengthen your relationships with family members while treating your condition, consider taking part in family therapy. You might also attend self-help groups alone or with a loved one. Be honest about how your symptoms affect your relationships. Make an effort to communicate respectfully instead of fighting, and find ways to show others that you care. Psychotherapy may be useful in helping you cope with how ADHD affects your relationships. How to recognize changes in your condition The following signs may mean that your treatment is working well and your condition is improving: Consistently being on time for appointments. Being more organized at home and work. Other people noticing improvements in your behavior. Achieving goals that you set for yourself. Thinking more clearly. The following signs may mean that your treatment is not working very well: Feeling impatience or more confusion. Missing, forgetting, or being late for appointments. An increasing sense of disorganization and messiness. More difficulty in reaching goals that you set for yourself. Loved ones becoming angry or frustrated with you. Follow these instructions at home: Take over-the-counter and prescription medicines only as told by your health care provider. Check with your health care  provider before taking any new medicines. Create structure and an organized atmosphere at home. For example: Make a list of tasks, then rank them from most important to least important. Work on one task at a time until your listed tasks are done. Make a daily schedule and follow it consistently every day. Use an appointment calendar, and check it 2 or 3 times a day to keep on track. Keep it with you when you leave the house. Create spaces where you keep certain things, and always put things back in their places after you use them. Keep all follow-up visits as told by your health care provider. This is important. Where to find support Talking to others  Keep emotion out of important discussions and speak in a calm, logical way. Listen closely and patiently to your loved ones. Try to understand their point of view, and try to avoid getting defensive. Take responsibility for the consequences of your actions. Ask that others do not take your behaviors personally. Aim to solve problems as they come up, and express your feelings instead of bottling them up. Talk openly about what you need from your loved ones and how they can support you. Consider going to family therapy sessions or having your family meet with a specialist who deals with ADHD-related behavior problems. Finances Not all insurance plans cover mental health care, so it is important to check with your insurance carrier. If paying for co-pays or counseling services is  a problem, search for a local or county mental health care center. Public mental health care services may be offered there at a low cost or no cost when you are not able to see a private health care provider. If you are taking medicine for ADHD, you may be able to get the generic form, which may be less expensive than brand-name medicine. Some makers of prescription medicines also offer help to patients who cannot afford the medicines that they need. Questions to ask your  health care provider: What are the risks and benefits of taking medicines? Would I benefit from therapy? How often should I follow up with a health care provider? Contact a health care provider if: You have side effects from your medicines, such as: Repeated muscle twitches, coughing, or speech outbursts. Sleep problems. Loss of appetite. Depression. New or worsening behavior problems. Dizziness. Unusually fast heartbeat. Stomach pains. Headaches. Get help right away if: You have a severe reaction to a medicine. Your behavior suddenly gets worse. Summary With treatment and support, you can live with ADHD and manage your symptoms. The medicines that are most often prescribed for ADHD are stimulants. Consider taking part in family therapy or self-help groups with family members or friends. When you talk with friends and family about your ADHD, be patient and communicate openly. Take over-the-counter and prescription medicines only as told by your health care provider. Check with your health care provider before taking any new medicines. This information is not intended to replace advice given to you by your health care provider. Make sure you discuss any questions you have with your health care provider. Document Revised: 05/20/2020 Document Reviewed: 05/20/2020 Elsevier Patient Education  2022 [employer].

## 2022-02-15 NOTE — Progress Notes (Signed)
Virtual Visit via Video Note  I connected with Sarah West  on 02/15/22 at  1:10 PM EST by a video enabled telemedicine application and verified that I am speaking with the correct person using two identifiers.  Location patient: [place] Location provider:work or home office Persons participating in the virtual visit: patient, provider  I discussed the limitations and requested verbal permission for telemedicine visit. The patient expressed understanding and agreed to proceed.   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  Less interesting subject matter less able to focus, more easily distracted   -Pertinent past medical history: see below -Pertinent medication allergies: Allergies  Allergen Reactions   Adipex-P [Phentermine]     Ringing/dizziness    -COVID-19 vaccine status:  Immunization History  Administered Date(s) Administered   DTaP 07/03/1985, 10/02/1985, 10/27/1986, 08/16/1988, 07/17/1989   Hepatitis B 09/22/1997, 11/11/1997, 04/06/1998   IPV 07/03/1985, 10/01/1985, 10/27/1986, 07/17/1989   Influenza-Unspecified 09/17/2020   MMR 10/27/1986   Moderna Sars-Covid-2 Vaccination 06/12/2020, 07/10/2020   Td 01/20/2005     ROS: See pertinent positives and negatives per HPI.  Past Medical History:  Diagnosis Date   Anxiety    Asthma    COVID-19    12/2020   History of cervical dysplasia    s/p cryotherapy 8 years ago   History of genital warts    Hx of migraines    teenager    Seizure ([place])    grand mal   UTI (urinary tract infection)     Past Surgical History:  Procedure Laterality Date   TONSILLECTOMY     2009   WISDOM TOOTH EXTRACTION       Current Outpatient Medications:    ALPRAZolam (XANAX) 0.25 MG tablet, TAKE 1 TABLET BY MOUTH DAILY AS NEEDED FOR ANXIETY, Disp: 30 tablet, Rfl: 2    fluticasone (FLONASE) 50 MCG/ACT nasal spray, SPRAY 2 SPRAYS INTO EACH NOSTRIL EVERY DAY, Disp: 16 mL, Rfl: 1   ibuprofen (ADVIL) 800 MG tablet, Take 1 tablet (800 mg total) by mouth 3 (three) times daily., Disp: 90 tablet, Rfl: 1   loratadine (CLARITIN) 10 MG tablet, Take 1 tablet (10 mg total) by mouth daily as needed., Disp: 90 tablet, Rfl: 3   esomeprazole (NEXIUM) 40 MG capsule, Take 1 capsule (40 mg total) by mouth daily. Before food 30 min (Patient not taking: Reported on 02/15/2022), Disp: 30 capsule, Rfl: 0   famotidine (PEPCID) 20 MG tablet, TAKE 1 TABLET (20 MG TOTAL) BY MOUTH DAILY. [place] (Patient not taking: Reported on 02/15/2022), Disp: 30 tablet, Rfl: 1  EXAM:  VITALS per patient if applicable:  GENERAL: alert, oriented, appears well and in no acute distress   PSYCH/NEURO: pleasant and cooperative, no obvious depression or anxiety, speech and thought processing grossly intact  ASSESSMENT AND PLAN:  Discussed the following assessment and plan:  Attention and concentration deficit - Plan: Ambulatory referral to Psychology  Anxiety - Plan: Ambulatory referral to Psychology If + consider psychiatrist for adhd meds  -we discussed possible serious and likely etiologies, options for evaluation and workup, limitations of telemedicine visit vs in person visit, treatment, treatment risks and precautions. Pt is agreeable to treatment via telemedicine at this moment.    I discussed the assessment and treatment plan with the patient. The patient was provided an opportunity to  ask questions and all were answered. The patient agreed with the plan and demonstrated an understanding of the instructions.    Time spent 20 minutes Delorise Jackson, MD

## 2022-02-15 NOTE — Telephone Encounter (Signed)
Referral not sent waiting on ofc notes to be completed. thanks

## 2022-03-15 ENCOUNTER — Encounter: Payer: Self-pay | Admitting: Internal Medicine

## 2022-03-15 NOTE — Telephone Encounter (Signed)
Please advise on referral or does Patient need an appointment?  ?

## 2022-05-05 ENCOUNTER — Other Ambulatory Visit: Payer: Self-pay | Admitting: Internal Medicine

## 2022-05-05 ENCOUNTER — Encounter: Payer: Self-pay | Admitting: Internal Medicine

## 2022-05-05 DIAGNOSIS — F411 Generalized anxiety disorder: Secondary | ICD-10-CM

## 2022-05-05 MED ORDER — ALPRAZOLAM 0.25 MG PO TABS: 0.2500 mg | ORAL_TABLET | Freq: Every day | ORAL | 2 refills | Status: DC | PRN

## 2022-05-17 ENCOUNTER — Telehealth: Payer: Self-pay | Admitting: Internal Medicine

## 2022-05-17 NOTE — Telephone Encounter (Signed)
There was recall on xanax please contact her pharmacy her insurance let me know inform 

## 2022-07-21 ENCOUNTER — Ambulatory Visit (INDEPENDENT_AMBULATORY_CARE_PROVIDER_SITE_OTHER): Payer: Managed Care, Other (non HMO) | Admitting: Internal Medicine

## 2022-07-21 ENCOUNTER — Encounter: Payer: Self-pay | Admitting: Internal Medicine

## 2022-07-21 VITALS — BP 118/66 | HR 76 | Temp 98.5°F | Ht 62.5 in | Wt 152.6 lb

## 2022-07-21 DIAGNOSIS — N644 Mastodynia: Secondary | ICD-10-CM | POA: Diagnosis not present

## 2022-07-21 DIAGNOSIS — Z0000 Encounter for general adult medical examination without abnormal findings: Secondary | ICD-10-CM | POA: Diagnosis not present

## 2022-07-21 DIAGNOSIS — J309 Allergic rhinitis, unspecified: Secondary | ICD-10-CM

## 2022-07-21 DIAGNOSIS — Z1329 Encounter for screening for other suspected endocrine disorder: Secondary | ICD-10-CM | POA: Diagnosis not present

## 2022-07-21 DIAGNOSIS — Z1389 Encounter for screening for other disorder: Secondary | ICD-10-CM | POA: Diagnosis not present

## 2022-07-21 DIAGNOSIS — F411 Generalized anxiety disorder: Secondary | ICD-10-CM

## 2022-07-21 DIAGNOSIS — L821 Other seborrheic keratosis: Secondary | ICD-10-CM

## 2022-07-21 DIAGNOSIS — Z1283 Encounter for screening for malignant neoplasm of skin: Secondary | ICD-10-CM

## 2022-07-21 LAB — COMPREHENSIVE METABOLIC PANEL
ALT: 11 U/L (ref 0–35)
AST: 11 U/L (ref 0–37)
Albumin: 4.4 g/dL (ref 3.5–5.2)
Alkaline Phosphatase: 59 U/L (ref 39–117)
BUN: 12 mg/dL (ref 6–23)
CO2: 28 mEq/L (ref 19–32)
Calcium: 9.5 mg/dL (ref 8.4–10.5)
Chloride: 101 mEq/L (ref 96–112)
Creatinine, Ser: 0.62 mg/dL (ref 0.40–1.20)
GFR: 113.93 mL/min (ref >60.00)
Glucose, Bld: 85 mg/dL (ref 70–99)
Potassium: 4.1 mEq/L (ref 3.5–5.1)
Sodium: 137 mEq/L (ref 135–145)
Total Bilirubin: 0.4 mg/dL (ref 0.2–1.2)
Total Protein: 6.8 g/dL (ref 6.0–8.3)

## 2022-07-21 LAB — CBC WITH DIFFERENTIAL/PLATELET
Basophils Absolute: 0 10*3/uL (ref 0.0–0.1)
Basophils Relative: 0.8 % (ref 0.0–3.0)
Eosinophils Absolute: 0.2 10*3/uL (ref 0.0–0.7)
Eosinophils Relative: 3.7 % (ref 0.0–5.0)
HCT: 39.7 % (ref 36.0–46.0)
Hemoglobin: 13.1 g/dL (ref 12.0–15.0)
Lymphocytes Relative: 29.2 % (ref 12.0–46.0)
Lymphs Abs: 1.7 10*3/uL (ref 0.7–4.0)
MCHC: 32.9 g/dL (ref 30.0–36.0)
MCV: 92.9 fl (ref 78.0–100.0)
Monocytes Absolute: 0.4 10*3/uL (ref 0.1–1.0)
Monocytes Relative: 7.2 % (ref 3.0–12.0)
Neutro Abs: 3.4 10*3/uL (ref 1.4–7.7)
Neutrophils Relative %: 59.1 % (ref 43.0–77.0)
Platelets: 320 10*3/uL (ref 150.0–400.0)
RBC: 4.28 Mil/uL (ref 3.87–5.11)
RDW: 13.2 % (ref 11.5–15.5)
WBC: 5.7 10*3/uL (ref 4.0–10.5)

## 2022-07-21 LAB — LIPID PANEL
Cholesterol: 191 mg/dL (ref 0–200)
HDL: 67.4 mg/dL (ref >39.00)
LDL Cholesterol: 109 mg/dL — ABNORMAL HIGH (ref 0–99)
NonHDL: 123.49
Total CHOL/HDL Ratio: 3
Triglycerides: 74 mg/dL (ref 0.0–149.0)
VLDL: 14.8 mg/dL (ref 0.0–40.0)

## 2022-07-21 LAB — TSH: TSH: 0.78 u[IU]/mL (ref 0.35–5.50)

## 2022-07-21 MED ORDER — LORATADINE 10 MG PO TABS: 10.0000 mg | ORAL_TABLET | Freq: Every day | ORAL | 3 refills | Status: AC | PRN

## 2022-07-21 MED ORDER — ALPRAZOLAM 0.25 MG PO TABS: 0.2500 mg | ORAL_TABLET | Freq: Every day | ORAL | 5 refills | Status: DC | PRN

## 2022-07-21 MED ORDER — FLUTICASONE PROPIONATE 50 MCG/ACT NA SUSP: NASAL | 11 refills | Status: DC

## 2022-07-21 NOTE — Progress Notes (Signed)
Chief Complaint  Patient presents with   Annual Exam   Annual  1. Anxiety wants refill of xanax 0.25 qd prn declines therapy for now doing better since neighbors moved  2. Skin lesion posterior right shoulder and vaginal region referred derm   Review of Systems  Constitutional:  Negative for weight loss.  HENT:  Negative for hearing loss.   Eyes:  Negative for blurred vision.  Respiratory:  Negative for shortness of breath.   Cardiovascular:  Negative for chest pain.  Gastrointestinal:  Negative for abdominal pain and blood in stool.  Genitourinary:  Negative for dysuria.  Musculoskeletal:  Negative for falls and joint pain.  Skin:  Negative for rash.  Neurological:  Negative for headaches.  Psychiatric/Behavioral:  Negative for depression.    Past Medical History:  Diagnosis Date   Anxiety    Asthma    COVID-19    12/2020   History of cervical dysplasia    s/p cryotherapy 8 years ago   History of genital warts    Hx of migraines    teenager    Seizure ([facility])    grand mal   UTI (urinary tract infection)    Past Surgical History:  Procedure Laterality Date   TONSILLECTOMY     2009   WISDOM TOOTH EXTRACTION     Family History  Problem Relation Age of Onset   Asthma Mother    Breast cancer Maternal Grandmother    Cancer Maternal Grandmother        breast   Stroke Maternal Grandmother    Heart disease Paternal Grandfather    Cancer Maternal Aunt    Cervical cancer Other    Colon cancer Neg Hx    Rectal cancer Neg Hx    Esophageal cancer Neg Hx    Pancreatic cancer Neg Hx    Liver cancer Neg Hx    Social History   Socioeconomic History   Marital status: Married    Spouse name: Not on file   Number of children: Not on file   Years of education: Not on file   Highest education level: Not on file  Occupational History   Not on file  Tobacco Use   Smoking status: Former    Packs/day: 0.25    Types: Cigarettes   Smokeless tobacco: Never  Vaping Use    Vaping Use: Never used  Substance and Sexual Activity   Alcohol use: Yes    Alcohol/week: 2.0 standard drinks of alcohol    Types: 2 Glasses of wine per week   Drug use: No   Sexual activity: Yes    Birth control/protection: Surgical, Other-see comments    Comment: vastormy  Other Topics Concern   Not on file  Social History Narrative   Married since 2010    No kids    Works at [employer]    No guns    Wears seat belt    Safe in relationship    Social Determinants of Corporate investment banker Strain: Not on file  Food Insecurity: Not on file  Transportation Needs: Not on file  Physical Activity: Not on file  Stress: Not on file  Social Connections: Not on file  Intimate Partner Violence: Not on file   Current Meds  Medication Sig   [DISCONTINUED] ALPRAZolam (XANAX) 0.25 MG tablet Take 1 tablet (0.25 mg total) by mouth daily as needed. for anxiety   [DISCONTINUED] fluticasone (FLONASE) 50 MCG/ACT nasal spray SPRAY 2 SPRAYS INTO EACH NOSTRIL  EVERY DAY   [DISCONTINUED] loratadine (CLARITIN) 10 MG tablet Take 1 tablet (10 mg total) by mouth daily as needed.   Allergies  Allergen Reactions   Adipex-P [Phentermine]     Ringing/dizziness    No results found for this or any previous visit (from the past 2160 hour(s)). Objective  Body mass index is 27.47 kg/m. Wt Readings from Last 3 Encounters:  07/21/22 152 lb 9.6 oz (69.2 kg)  02/15/22 150 lb (68 kg)  09/06/21 146 lb (66.2 kg)   Temp Readings from Last 3 Encounters:  07/21/22 98.5 F (36.9 C) (Oral)  09/06/21 99.3 F (37.4 C) (Skin)  07/20/21 98.3 F (36.8 C) (Oral)   BP Readings from Last 3 Encounters:  07/21/22 118/66  02/15/22 114/74  09/06/21 117/74   Pulse Readings from Last 3 Encounters:  07/21/22 76  09/06/21 83  09/01/21 90    Physical Exam Vitals and nursing note reviewed.  Constitutional:      Appearance: Normal appearance. She is well-developed and well-groomed.  HENT:     Head:  Normocephalic and atraumatic.  Eyes:     Conjunctiva/sclera: Conjunctivae normal.     Pupils: Pupils are equal, round, and reactive to light.  Cardiovascular:     Rate and Rhythm: Normal rate and regular rhythm.     Heart sounds: Normal heart sounds. No murmur heard. Pulmonary:     Effort: Pulmonary effort is normal.     Breath sounds: Normal breath sounds.  Abdominal:     General: Abdomen is flat. Bowel sounds are normal.     Tenderness: There is no abdominal tenderness.  Musculoskeletal:        General: No tenderness.  Skin:    General: Skin is warm and dry.  Neurological:     General: No focal deficit present.     Mental Status: She is alert and oriented to person, place, and time. Mental status is at baseline.     Cranial Nerves: Cranial nerves 2-12 are intact.     Motor: Motor function is intact.     Coordination: Coordination is intact.     Gait: Gait is intact.  Psychiatric:        Attention and Perception: Attention and perception normal.        Mood and Affect: Mood and affect normal.        Speech: Speech normal.        Behavior: Behavior normal. Behavior is cooperative.        Thought Content: Thought content normal.        Cognition and Memory: Cognition and memory normal.        Judgment: Judgment normal.     Assessment  Plan  Annual physical exam - Plan: Comprehensive metabolic panel, Lipid panel, CBC with Differential/Platelet, TSH, Urinalysis, Routine w reflex microscopic See below    Breast pain right>left 3 oclock - Plan: MM DIAG BREAST TOMO BILATERAL, US BREAST LTD UNI LEFT INC AXILLA, US BREAST LTD UNI RIGHT INC AXILLA  GAD (generalized anxiety disorder) - Plan: ALPRAZolam (XANAX) 0.25 MG tablet qd prn  Skin cancer screening - Plan: Ambulatory referral to Dermatology Seborrheic keratosis - Plan: Ambulatory referral to Dermatology  Allergic rhinitis, unspecified seasonality, unspecified trigger - Plan: loratadine (CLARITIN) 10 MG tablet, fluticasone  (FLONASE) 50 MCG/ACT nasal spray   HM Fasting labs today  Had flu shot fall 2018 and 2019 with [facility] covid 2/2 moderna declines further  Declines HIV   Tdap had 08/19/16 Declines STD testing  Pap 05/13/20 neg neg HPV Dr. Valentino Saxon  Mammo referred today [facility] for breast pain Colonoscopy age 32   rec smoking cessation 4-5 cig/qd on and off since age 25 y.o  rec take D3 2000 IU qd otc rec healthy diet and exercise   Provider: Dr. French Ana McLean-Scocuzza-Internal Medicine

## 2022-07-21 NOTE — Patient Instructions (Addendum)
[facility]  or [facility]   Dr. Rozanna Boer. [place]   Pap due 05/14/23   [facility] Located in: [facility] Address: [address], [place], [place] [address] Hours:  Open ? Closes 5?PM Phone: [phone]   [facility] [address] [place]  [phone] Open ? Closes 5?PM  Seborrheic Keratosis A seborrheic keratosis is a common, noncancerous (benign) skin growth. These growths are velvety, waxy, or rough spots that appear on the skin. They are often tan, brown, or black. The skin growths can be flat or raised and may be scaly. What are the causes? The cause of this condition is not known. What increases the risk? You are more likely to develop this condition if you: Have a family history of seborrheic keratosis. Are 65 years old or older. Are pregnant. Have had estrogen replacement therapy. What are the signs or symptoms? Symptoms of this condition include growths on the face, chest, shoulders, back, or other areas. These growths: Are usually painless, but may become irritated and itchy. Can be tan, yellow, brown, black, or other colors. Are slightly raised or have a flat surface. Are sometimes rough or wart-like in texture. Are often velvety or waxy on the surface. Are round or oval-shaped. Often occur in groups, but may occur as a single growth. How is this diagnosed? This condition is diagnosed with a medical history and physical exam. A sample of the growth may be tested (skin biopsy). You may also need to see a skin specialist (dermatologist). How is this treated? Treatment is not usually needed for this condition unless the growths are irritated or bleed often. You may also choose to have the growths removed if you do not like their appearance. Growth removal may include a procedure in which: Liquid nitrogen is applied to "freeze" off the growth (cryosurgery). This is the most common procedure. The growth is  burned off with electricity (electrocautery). The growth is removed by scraping (curettage). Follow these instructions at home: Watch your growth or growths for any changes. Do not scratch or pick at the growth or growths. This can cause them to become irritated or infected. Contact a health care provider if: You suddenly have many new growths. Your growth bleeds, itches, or hurts. Your growth suddenly becomes larger or changes color. Summary A seborrheic keratosis is a common, noncancerous skin growth. Treatment is not usually needed for this condition unless the growths are irritated or bleed often. Watch your growth or growths for any changes. Contact a health care provider if you suddenly have many new growths or your growth suddenly becomes larger or changes color. This information is not intended to replace advice given to you by your health care provider. Make sure you discuss any questions you have with your health care provider. Document Revised: 02/18/2022 Document Reviewed: 02/18/2022 Elsevier Patient Education  2023 Elsevier Inc.  Breast Tenderness Breast tenderness is a common problem for women of all ages, but may also occur in men. Breast tenderness has many possible causes, including hormone changes, infections, taking certain medicines, and caffeine intake. In women, the pain usually comes and goes with the menstrual cycle, but it can also be constant. Breast tenderness may range from mild discomfort to severe pain. You may have tests, such as a mammogram or an ultrasound, to check for any unusual findings. Having breast tenderness usually does not mean that you have breast cancer. Follow these instructions at home: Managing pain and  discomfort  If directed, put ice on the painful area. To do this: Put ice in a plastic bag. Place a towel between your skin and the bag. Leave the ice on for 20 minutes, 2-3 times a day. If your skin turns bright red, remove the ice right  away to prevent skin damage. The risk of skin damage is higher if you cannot feel pain, heat, or cold. Wear a supportive bra or chest support: During exercise. While sleeping, if your breasts are very tender. Medicines Take over-the-counter and prescription medicines only as told by your health care provider. If the cause of your pain is an infection, you may be prescribed an antibiotic medicine. If you were prescribed antibiotics, take them as told by your health care provider. Do not stop using the antibiotic even if you start to feel better. Eating and drinking Decrease the amount of caffeine in your diet. Instead, drink more water and choose caffeine-free drinks. Your health care provider may recommend that you lessen the amount of fat in your diet. You can do this by: Limiting fried foods. Cooking foods using methods such as baking, boiling, grilling, and broiling. General instructions  Keep a log of the days and times when your breasts are most tender. Ask your health care provider how to do breast exams at home. This will help you notice if you have an unusual growth or lump. Keep all follow-up visits. Contact a health care provider if: Any part of your breast is hard, red, and hot to the touch. This may be a sign of infection. You are a woman and have a new or painful lump in your breast that remains after your menstrual period ends. You are not breastfeeding and you have fluid, especially blood or pus, coming out of your nipples. You have a fever. Your pain does not improve or it gets worse. Your pain is interfering with your daily activities. Summary Breast tenderness may range from mild discomfort to severe pain. Breast tenderness has many possible causes, including hormone changes, infections, taking certain medicines, and caffeine intake. It can be treated with ice, wearing a supportive bra or chest support, and medicines. Make changes to your diet as told by your health care  provider. This information is not intended to replace advice given to you by your health care provider. Make sure you discuss any questions you have with your health care provider. Document Revised: 02/16/2022 Document Reviewed: 02/16/2022 Elsevier Patient Education  2023 [employer].

## 2022-07-22 LAB — URINALYSIS, ROUTINE W REFLEX MICROSCOPIC
Bilirubin Urine: NEGATIVE
Glucose, UA: NEGATIVE
Hgb urine dipstick: NEGATIVE
Ketones, ur: NEGATIVE
Leukocytes,Ua: NEGATIVE
Nitrite: NEGATIVE
Protein, ur: NEGATIVE
Specific Gravity, Urine: 1.013 (ref 1.001–1.035)
pH: 7.5 (ref 5.0–8.0)

## 2022-07-26 ENCOUNTER — Encounter: Payer: Self-pay | Admitting: Internal Medicine

## 2022-07-26 LAB — HM MAMMOGRAPHY

## 2022-08-05 ENCOUNTER — Other Ambulatory Visit: Payer: Self-pay | Admitting: Internal Medicine

## 2022-08-05 DIAGNOSIS — F411 Generalized anxiety disorder: Secondary | ICD-10-CM

## 2022-08-06 ENCOUNTER — Encounter: Payer: Self-pay | Admitting: Internal Medicine

## 2022-08-07 ENCOUNTER — Encounter: Payer: Self-pay | Admitting: Internal Medicine

## 2022-08-09 ENCOUNTER — Telehealth: Payer: Self-pay

## 2022-08-09 NOTE — Telephone Encounter (Signed)
Called and spoke with pts pharmacy due to her reaching out to [place] via mychart in regards to being unable to pick up xanax pharmacy stated she picked up meds on yesterday   You  McLean-Scocuzza, Pasty Spillers, MD 4 minutes ago (11:16 AM)    Spoke with pharmacy pt actually picked up px on yesterday.    Eulis Foster, FNP  You 12 hours ago (10:56 PM)    Please call the pharmacy and inquire.     You  Eulis Foster, FNP Yesterday (8:39 AM)    [name], pt stated the pharmacy didn't let her pick up the last  Alprazolam  px. I saw a note to the pharmacy not to exceed 3 refills before November. Is this the reason has she exceeded the refill limit?    [name]  P [facility] (supporting Bevelyn Buckles, MD) Yesterday (8:19 AM)    Alprazolam 0.25mg     You  Sarah West Yesterday (8:18 AM)    Good Morning Ms. Bowell,   Could you provide the medication name you are referring to and we will see what the issue is.   --Lindaann Slough, CMA    Sarah West  P [facility] (supporting Pasty Spillers McLean-Scocuzza, MD) 3 days ago    You had written a multi month refill for me but the pharmacy wouldn't let me fill the last refill on my current prescription. I'm just hoping that you can help straighten this out for me so not to further delay receiving my prescription. I believe they are supposed to contact you. But I'm unsure of their accountability.    Thanks in advance, Sarah West

## 2022-11-15 ENCOUNTER — Encounter: Payer: Self-pay | Admitting: Family Medicine

## 2022-11-18 NOTE — Telephone Encounter (Signed)
Pt called about mychart message 

## 2022-11-21 ENCOUNTER — Telehealth: Payer: Self-pay

## 2022-11-21 ENCOUNTER — Other Ambulatory Visit: Payer: Self-pay | Admitting: Family

## 2022-11-21 DIAGNOSIS — F411 Generalized anxiety disorder: Secondary | ICD-10-CM

## 2022-11-21 MED ORDER — ALPRAZOLAM 0.25 MG PO TABS: 0.2500 mg | ORAL_TABLET | Freq: Every day | ORAL | 2 refills | Status: DC | PRN

## 2022-11-23 NOTE — Telephone Encounter (Signed)
Error. ng 

## 2023-01-25 NOTE — Progress Notes (Signed)
SUBJECTIVE:   Chief Complaint  Patient presents with   Establish Care    Transfer of Care   HPI Patient presents to clinic to transfer care.  No acute concerns  General anxiety Takes Xanax 0.25 mg twice daily for anxiety monthly occurs around her cycle.  Reports tried BuSpar and Klonopin in the past but had not been beneficial to relieve symptoms.  No previous psychiatric risk evaluation.  Endorses may have tried SSRI in past but unsure what medication and reports did not like side effects.  Does not want to be on anything long-term.    EtOH/THC use Endorses 3 shots of tequila 3 days a week and nightly THC use.  Previous tobacco use, quit 3 years ago.  Works from home.  No indication for birth control as husband had vasectomy    PERTINENT PMH / PSH: GAD Asthma History of migraines History of seizures, no indication for medication EtOH use THC use  OBJECTIVE:  BP 114/74   Pulse 84   Temp 98.4 F (36.9 C)   Ht 5' 2.5" (1.588 m)   Wt 156 lb (70.8 kg)   LMP 01/19/2023   SpO2 99%   BMI 28.08 kg/m    Physical Exam Vitals reviewed.  Constitutional:      General: She is not in acute distress.    Appearance: She is not ill-appearing.  HENT:     Head: Normocephalic.     Nose: Nose normal.  Eyes:     Conjunctiva/sclera: Conjunctivae normal.  Cardiovascular:     Rate and Rhythm: Normal rate and regular rhythm.     Heart sounds: Normal heart sounds.  Pulmonary:     Effort: Pulmonary effort is normal.     Breath sounds: Normal breath sounds.  Abdominal:     General: Abdomen is flat. Bowel sounds are normal.     Palpations: Abdomen is soft.  Musculoskeletal:        General: Normal range of motion.     Cervical back: Normal range of motion.  Neurological:     Mental Status: She is alert and oriented to person, place, and time. Mental status is at baseline.  Psychiatric:        Mood and Affect: Mood normal.        Behavior: Behavior normal.        Thought  Content: Thought content normal.        Judgment: Judgment normal.       01/26/2023    9:02 AM 07/20/2021   10:37 AM 07/17/2020    9:41 AM 07/18/2019    8:07 AM 10/25/2018   11:34 AM  Depression screen PHQ 2/9  Decreased Interest 0 1 0 0 0  Down, Depressed, Hopeless 0 1 0 0 0  PHQ - 2 Score 0 2 0 0 0  Altered sleeping 2 2     Tired, decreased energy 2 1     Change in appetite 1 1     Feeling bad or failure about yourself  0 0     Trouble concentrating 1 2     Moving slowly or fidgety/restless 0 1     Suicidal thoughts 0 0     PHQ-9 Score 6 9     Difficult doing work/chores Not difficult at all Somewhat difficult       ASSESSMENT/PLAN:  Mood disorder [facility]) Assessment & Plan: Chronic.  Stable.  Long-term daily use of benzo for control.  Polysubstance use, EtOH, 3 shots tequila  3 days a week and nightly THC use.  I think patient would benefit from long-term SSRI use as well as CBT.  Denies any SI/HI. Discussed with patient weaning Xanax in future given risks of benzodiazepine use.  Will need to long wean Consider Celexa in future Consider addition of Atarax as needed     HCM Recent ultrasound breast negative.  Recommendation regular screening in 3 years at age 77.  Due 8/26. Recommend Tdap  PDMP reviewed  Return in about 7 months (around 08/27/2023).  Carollee Leitz, MD

## 2023-01-25 NOTE — Patient Instructions (Incomplete)
It was a pleasure meeting you today. Thank you for allowing me to take part in your health care.  Our goals for today as we discussed include:  Tetanus booster due  If you have any questions or concerns, please do not hesitate to call the office at [phone].  I look forward to our next visit and until then take care and stay safe.  Regards,   Carollee Leitz, MD   [facility]

## 2023-01-26 ENCOUNTER — Ambulatory Visit (INDEPENDENT_AMBULATORY_CARE_PROVIDER_SITE_OTHER): Payer: Managed Care, Other (non HMO) | Admitting: Family Medicine

## 2023-01-26 ENCOUNTER — Encounter: Payer: Self-pay | Admitting: Family Medicine

## 2023-01-26 VITALS — BP 114/74 | HR 84 | Temp 98.4°F | Ht 62.5 in | Wt 156.0 lb

## 2023-01-26 DIAGNOSIS — G43809 Other migraine, not intractable, without status migrainosus: Secondary | ICD-10-CM

## 2023-01-26 DIAGNOSIS — E559 Vitamin D deficiency, unspecified: Secondary | ICD-10-CM

## 2023-01-26 DIAGNOSIS — F411 Generalized anxiety disorder: Secondary | ICD-10-CM

## 2023-01-26 DIAGNOSIS — F39 Unspecified mood [affective] disorder: Secondary | ICD-10-CM | POA: Diagnosis not present

## 2023-01-27 ENCOUNTER — Encounter: Payer: Self-pay | Admitting: Family Medicine

## 2023-01-28 ENCOUNTER — Encounter: Payer: Self-pay | Admitting: Family Medicine

## 2023-01-28 DIAGNOSIS — F39 Unspecified mood [affective] disorder: Secondary | ICD-10-CM | POA: Insufficient documentation

## 2023-01-28 NOTE — Assessment & Plan Note (Signed)
Chronic.  Stable.  Long-term daily use of benzo for control.  Polysubstance use, EtOH, 3 shots tequila 3 days a week and nightly THC use.  I think patient would benefit from long-term SSRI use as well as CBT.  Denies any SI/HI. Discussed with patient weaning Xanax in future given risks of benzodiazepine use.  Will need to long wean Consider Celexa in future Consider addition of Atarax as needed

## 2023-04-03 ENCOUNTER — Encounter: Payer: Self-pay | Admitting: Family Medicine

## 2023-04-03 ENCOUNTER — Other Ambulatory Visit: Payer: Self-pay | Admitting: Family Medicine

## 2023-04-03 DIAGNOSIS — F411 Generalized anxiety disorder: Secondary | ICD-10-CM

## 2023-04-03 MED ORDER — ALPRAZOLAM 0.25 MG PO TABS: 0.2500 mg | ORAL_TABLET | Freq: Every day | ORAL | 0 refills | Status: DC | PRN

## 2023-06-28 ENCOUNTER — Encounter: Payer: Self-pay | Admitting: Internal Medicine

## 2023-06-28 ENCOUNTER — Other Ambulatory Visit: Payer: Self-pay | Admitting: Family Medicine

## 2023-06-28 DIAGNOSIS — F411 Generalized anxiety disorder: Secondary | ICD-10-CM

## 2023-06-28 MED ORDER — ALPRAZOLAM 0.25 MG PO TABS: 0.2500 mg | ORAL_TABLET | Freq: Every day | ORAL | 0 refills | Status: DC | PRN

## 2023-08-03 ENCOUNTER — Encounter (INDEPENDENT_AMBULATORY_CARE_PROVIDER_SITE_OTHER): Payer: Self-pay

## 2023-08-28 ENCOUNTER — Encounter: Payer: Managed Care, Other (non HMO) | Admitting: Family Medicine

## 2023-09-06 ENCOUNTER — Other Ambulatory Visit: Payer: Self-pay | Admitting: Family Medicine

## 2023-09-06 ENCOUNTER — Encounter: Payer: Self-pay | Admitting: Family Medicine

## 2023-09-06 ENCOUNTER — Ambulatory Visit (INDEPENDENT_AMBULATORY_CARE_PROVIDER_SITE_OTHER): Payer: Managed Care, Other (non HMO) | Admitting: Family Medicine

## 2023-09-06 VITALS — BP 130/64 | HR 98 | Temp 98.0°F | Resp 16 | Ht 62.25 in | Wt 158.4 lb

## 2023-09-06 DIAGNOSIS — N9089 Other specified noninflammatory disorders of vulva and perineum: Secondary | ICD-10-CM

## 2023-09-06 DIAGNOSIS — F411 Generalized anxiety disorder: Secondary | ICD-10-CM

## 2023-09-06 DIAGNOSIS — F39 Unspecified mood [affective] disorder: Secondary | ICD-10-CM

## 2023-09-06 DIAGNOSIS — E785 Hyperlipidemia, unspecified: Secondary | ICD-10-CM

## 2023-09-06 DIAGNOSIS — J4522 Mild intermittent asthma with status asthmaticus: Secondary | ICD-10-CM

## 2023-09-06 DIAGNOSIS — Z0000 Encounter for general adult medical examination without abnormal findings: Secondary | ICD-10-CM

## 2023-09-06 DIAGNOSIS — E559 Vitamin D deficiency, unspecified: Secondary | ICD-10-CM | POA: Diagnosis not present

## 2023-09-06 DIAGNOSIS — J4520 Mild intermittent asthma, uncomplicated: Secondary | ICD-10-CM

## 2023-09-06 DIAGNOSIS — E538 Deficiency of other specified B group vitamins: Secondary | ICD-10-CM

## 2023-09-06 MED ORDER — CLOTRIMAZOLE-BETAMETHASONE 1-0.05 % EX CREA: 1.0000 | TOPICAL_CREAM | Freq: Every day | CUTANEOUS | 0 refills | Status: DC

## 2023-09-06 MED ORDER — ALPRAZOLAM 0.25 MG PO TABS: 0.2500 mg | ORAL_TABLET | Freq: Every day | ORAL | 0 refills | Status: DC | PRN

## 2023-09-06 MED ORDER — AIRSUPRA 90-80 MCG/ACT IN AERO: 90.0000 ug | INHALATION_SPRAY | RESPIRATORY_TRACT | 3 refills | Status: DC | PRN

## 2023-09-06 NOTE — Progress Notes (Signed)
SUBJECTIVE:   Chief Complaint  Patient presents with   Annual Exam   HPI Patient presents to clinic for annual physical  Concern for labial swelling/irritation Symptoms started 2 months ago Endorses feeling of harder tissue.  Felt worse with constricting clothing and has since been wearing loose clothing.  Denies any pain, vaginal discharge, fevers, dyspareunia, abnormal vaginal discharge, vaginal lesions or lumps.  PAP up to date.  Due 2026. Endorses having to use vagisil regularly for vaginal irritation.     Mood disorder Primarily Anxiety. Requesting refill of Xanax.  Endorses THC/EtOH use.  Has previously tried Buspar, Klonipin without relief. Discussed non opioid contract and UDS.  Patient agreeable. Denies SI/HI  PERTINENT PMH / PSH: As above  OBJECTIVE:  BP 130/64   Pulse 98   Temp 98 F (36.7 C)   Resp 16   Ht 5' 2.25" (1.581 m)   Wt 158 lb 6 oz (71.8 kg)   LMP 09/04/2023   SpO2 98%   BMI 28.73 kg/m    Physical Exam Vitals reviewed. Exam conducted with a chaperone present.  Constitutional:      General: She is not in acute distress.    Appearance: She is not ill-appearing.  HENT:     Head: Normocephalic.     Left Ear: External ear normal.     Nose: Nose normal.     Mouth/Throat:     Mouth: Mucous membranes are moist.  Eyes:     Extraocular Movements: Extraocular movements intact.     Conjunctiva/sclera: Conjunctivae normal.     Pupils: Pupils are equal, round, and reactive to light.  Neck:     Thyroid: No thyromegaly or thyroid tenderness.     Vascular: No carotid bruit.  Cardiovascular:     Rate and Rhythm: Normal rate and regular rhythm.     Pulses: Normal pulses.     Heart sounds: Normal heart sounds.  Pulmonary:     Effort: Pulmonary effort is normal.     Breath sounds: Normal breath sounds.  Abdominal:     General: Bowel sounds are normal. There is no distension.     Palpations: Abdomen is soft.     Tenderness: There is no abdominal  tenderness. There is no right CVA tenderness, left CVA tenderness, guarding or rebound.  Genitourinary:    Exam position: Lithotomy position.     Pubic Area: No rash.      Labia:        Right: No rash, tenderness or lesion.        Left: No rash, tenderness or lesion.      Comments: Mild erythema noted at lower portion of introits Musculoskeletal:        General: Normal range of motion.     Cervical back: Normal range of motion.     Right lower leg: No edema.     Left lower leg: No edema.  Lymphadenopathy:     Cervical: No cervical adenopathy.  Skin:    Capillary Refill: Capillary refill takes less than 2 seconds.  Neurological:     General: No focal deficit present.     Mental Status: She is alert and oriented to person, place, and time. Mental status is at baseline.     Motor: No weakness.  Psychiatric:        Mood and Affect: Mood normal.        Behavior: Behavior normal.        Thought Content: Thought content normal.  Judgment: Judgment normal.        09/06/2023   11:48 AM 01/26/2023    9:02 AM 07/20/2021   10:37 AM 07/17/2020    9:41 AM 07/18/2019    8:07 AM  Depression screen PHQ 2/9  Decreased Interest 0 0 1 0 0  Down, Depressed, Hopeless 0 0 1 0 0  PHQ - 2 Score 0 0 2 0 0  Altered sleeping 1 2 2     Tired, decreased energy 1 2 1     Change in appetite 1 1 1     Feeling bad or failure about yourself  0 0 0    Trouble concentrating 2 1 2     Moving slowly or fidgety/restless 0 0 1    Suicidal thoughts 0 0 0    PHQ-9 Score 5 6 9     Difficult doing work/chores Somewhat difficult Not difficult at all Somewhat difficult        09/06/2023   11:49 AM 01/26/2023    9:02 AM 07/20/2021   10:38 AM 07/17/2020    9:42 AM  GAD 7 : Generalized Anxiety Score  Nervous, Anxious, on Edge 1 1 2 1   Control/stop worrying 0 1 3 0  Worry too much - different things 0 1 2 0  Trouble relaxing 1 0 3 0  Restless 1 0 2 0  Easily annoyed or irritable 1 1 3  0  Afraid - awful might happen  0 2 3 0  Total GAD 7 Score 4 6 18 1   Anxiety Difficulty Not difficult at all Not difficult at all Somewhat difficult Not difficult at all    ASSESSMENT/PLAN:  Annual physical exam Assessment & Plan: Mammogram at age 62 Recommend regular self breast exams Colonoscopy at age 28-50 Declined Flu vaccine Recommend Tetanus booster Declined STI testing No indication for BCP, husband with vasectomy Normotensive Annual labs today   Mood disorder [facility]) Assessment & Plan: Chronic.  Stable.  Polysubstance use, EtOH, 3 shots tequila 3 days a week and nightly THC use.  Denies any SI/HI. Would benefit from SSRI daily Prefers continued use of Xanax Refill Xanax 0.25 mg daily as needed. Non opioid contract and UDS today     Orders: -     ALPRAZolam; Take 1 tablet (0.25 mg total) by mouth daily as needed. for anxiety  Dispense: 30 tablet; Refill: 0 -     ToxASSURE Select 13 (MW), Urine -     Lipid panel; Future -     Comprehensive metabolic panel; Future -     CBC; Future  Vitamin D deficiency -     VITAMIN D 25 Hydroxy (Vit-D Deficiency, Fractures); Future  Vitamin B 12 deficiency -     Vitamin B12; Future  Mild intermittent asthma with status asthmaticus Assessment & Plan: Chronic.  Well controlled Switch Albuterol  Start Airsupra for rescue therapy   Labial irritation Assessment & Plan: No swelling noted on external vaginal exam Visible varicosity noted on left labia majora Mild erythema noted at opening of introitus. Trial temovate ointment BID x 5 days If no improvement can refer to OBGYN Recommend limited use of soaps and vagisil  Follow up if not improving   Hyperlipidemia, unspecified hyperlipidemia type Assessment & Plan: Check fasting lipids    PDMP reviewed  Return if symptoms worsen or fail to improve, for PCP.  Dana Allan, MD

## 2023-09-06 NOTE — Patient Instructions (Signed)
It was a pleasure meeting you today. Thank you for allowing me to take part in your health care.  Our goals for today as we discussed include:  Apply cream two times a day to affected area for 5 days then as needed Avoid soaps or lotions in sensitive area  Airsupra ordered.  Use for rescue therapy  Follow up as needed  If you have any questions or concerns, please do not hesitate to call the office at [phone].  I look forward to our next visit and until then take care and stay safe.  Regards,   Dana Allan, MD   [facility]

## 2023-09-08 ENCOUNTER — Other Ambulatory Visit: Payer: Self-pay

## 2023-09-08 ENCOUNTER — Other Ambulatory Visit: Payer: Self-pay | Admitting: Family Medicine

## 2023-09-08 ENCOUNTER — Encounter: Payer: Self-pay | Admitting: Family Medicine

## 2023-09-08 DIAGNOSIS — J4520 Mild intermittent asthma, uncomplicated: Secondary | ICD-10-CM

## 2023-09-08 MED ORDER — CLOTRIMAZOLE-BETAMETHASONE 1-0.05 % EX CREA: 1.0000 | TOPICAL_CREAM | Freq: Every day | CUTANEOUS | 0 refills | Status: DC

## 2023-09-08 MED ORDER — CLOBETASOL PROPIONATE 0.05 % EX OINT: 1.0000 | TOPICAL_OINTMENT | Freq: Two times a day (BID) | CUTANEOUS | 0 refills | Status: DC

## 2023-09-08 MED ORDER — AIRSUPRA 90-80 MCG/ACT IN AERO: 90.0000 ug | INHALATION_SPRAY | RESPIRATORY_TRACT | 3 refills | Status: AC | PRN

## 2023-09-12 ENCOUNTER — Encounter: Payer: Self-pay | Admitting: Family Medicine

## 2023-09-12 DIAGNOSIS — E538 Deficiency of other specified B group vitamins: Secondary | ICD-10-CM | POA: Insufficient documentation

## 2023-09-12 DIAGNOSIS — E785 Hyperlipidemia, unspecified: Secondary | ICD-10-CM | POA: Insufficient documentation

## 2023-09-12 DIAGNOSIS — N9089 Other specified noninflammatory disorders of vulva and perineum: Secondary | ICD-10-CM | POA: Insufficient documentation

## 2023-09-12 DIAGNOSIS — J4522 Mild intermittent asthma with status asthmaticus: Secondary | ICD-10-CM | POA: Insufficient documentation

## 2023-09-12 NOTE — Assessment & Plan Note (Signed)
Check fasting lipids

## 2023-09-12 NOTE — Assessment & Plan Note (Addendum)
Mammogram at age 37 Recommend regular self breast exams Colonoscopy at age 49-50 Declined Flu vaccine Recommend Tetanus booster Declined STI testing No indication for BCP, husband with vasectomy Normotensive Annual labs today

## 2023-09-12 NOTE — Assessment & Plan Note (Signed)
Chronic.  Well controlled Switch Albuterol  Start Airsupra for rescue therapy

## 2023-09-12 NOTE — Assessment & Plan Note (Signed)
Chronic.  Stable.  Polysubstance use, EtOH, 3 shots tequila 3 days a week and nightly THC use.  Denies any SI/HI. Would benefit from SSRI daily Prefers continued use of Xanax Refill Xanax 0.25 mg daily as needed. Non opioid contract and UDS today

## 2023-09-12 NOTE — Telephone Encounter (Signed)
Patient picked up medication from the pharmacy.

## 2023-09-12 NOTE — Assessment & Plan Note (Signed)
No swelling noted on external vaginal exam Visible varicosity noted on left labia majora Mild erythema noted at opening of introitus. Trial temovate ointment BID x 5 days If no improvement can refer to OBGYN Recommend limited use of soaps and vagisil  Follow up if not improving

## 2023-09-13 ENCOUNTER — Encounter: Payer: Self-pay | Admitting: Family Medicine

## 2023-09-13 LAB — TOXASSURE SELECT 13 (MW), URINE

## 2023-12-08 ENCOUNTER — Other Ambulatory Visit: Payer: Self-pay | Admitting: Family Medicine

## 2023-12-08 DIAGNOSIS — F39 Unspecified mood [affective] disorder: Secondary | ICD-10-CM

## 2023-12-08 MED ORDER — ALPRAZOLAM 0.25 MG PO TABS
0.2500 mg | ORAL_TABLET | Freq: Every day | ORAL | 0 refills | Status: DC | PRN
Start: 2023-12-08 — End: 2024-03-18

## 2024-03-18 ENCOUNTER — Other Ambulatory Visit: Payer: Self-pay | Admitting: Family Medicine

## 2024-03-18 DIAGNOSIS — J309 Allergic rhinitis, unspecified: Secondary | ICD-10-CM

## 2024-03-18 DIAGNOSIS — F39 Unspecified mood [affective] disorder: Secondary | ICD-10-CM

## 2024-03-18 MED ORDER — ALPRAZOLAM 0.25 MG PO TABS
0.2500 mg | ORAL_TABLET | Freq: Every day | ORAL | 0 refills | Status: DC | PRN
Start: 2024-03-18 — End: 2024-05-30

## 2024-03-18 MED ORDER — FLUTICASONE PROPIONATE 50 MCG/ACT NA SUSP
NASAL | 11 refills | Status: DC
Start: 2024-03-18 — End: 2024-10-17

## 2024-03-18 NOTE — Telephone Encounter (Signed)
 LOV: 09/06/2023  Flonase last filled by Dr [employer].  Sent mychart message for pt to call office to schedule a TOC prior to her next refill request.

## 2024-04-29 ENCOUNTER — Ambulatory Visit (INDEPENDENT_AMBULATORY_CARE_PROVIDER_SITE_OTHER)

## 2024-04-29 VITALS — BP 102/68 | HR 83 | Temp 97.6°F | Ht 62.25 in | Wt 163.2 lb

## 2024-04-29 DIAGNOSIS — E785 Hyperlipidemia, unspecified: Secondary | ICD-10-CM | POA: Diagnosis not present

## 2024-04-29 DIAGNOSIS — R5383 Other fatigue: Secondary | ICD-10-CM

## 2024-04-29 DIAGNOSIS — M25562 Pain in left knee: Secondary | ICD-10-CM | POA: Diagnosis not present

## 2024-04-29 DIAGNOSIS — Z87898 Personal history of other specified conditions: Secondary | ICD-10-CM | POA: Insufficient documentation

## 2024-04-29 DIAGNOSIS — Z0000 Encounter for general adult medical examination without abnormal findings: Secondary | ICD-10-CM | POA: Insufficient documentation

## 2024-04-29 DIAGNOSIS — E559 Vitamin D deficiency, unspecified: Secondary | ICD-10-CM

## 2024-04-29 NOTE — Progress Notes (Signed)
 Subjective:  Patient ID: Sarah West, female    DOB: 02/25/1986  Age: 38 y.o. MRN: [id number]  Chief Complaint  Patient presents with   Establish Care    HPI:  Discussed the use of AI scribe software for clinical note transcription with the patient, who gave verbal consent to proceed.  History of Present Illness   Sarah West is a 38 year old female who presents for establishment of care and medication review.  She has a history of seizures, with the first occurring in her early twenties and the last one in 2022. The seizures were attributed to stress, and she has not been on any seizure medications. She underwent a comprehensive workup including EEGs, MRIs, and a sleep-deprived EEG, all of which were unremarkable. Her seizures included a grand mal seizure and another episode characterized by weakness and shaking. She has not experienced any seizures in the past three years.  She has mild intermittent asthma and uses Airsupra  as needed, primarily when experiencing tightness while working outside in hot weather. The inhaler is still mostly full and does not require a refill at this time. Her insurance covers the medication.  She experiences occasional panic attacks, often coinciding with her menstrual cycle, and uses Xanax  as needed. She last filled her prescription in March and still has a significant amount left. She prefers not to take daily medication for anxiety.  She has a history of hyperlipidemia but has not been prescribed medication as her levels were not deemed high enough. She acknowledges needing to improve her diet and exercise habits.  She reports a history of cervical dysplasia and has not had a Pap smear in several years. She also has a history of B12 borderline deficiency and low energy levels.  She has an allergy to phentermine , which caused tinnitus, abdominal pain, and increased anxiety, leading to its discontinuation.  She reports left knee pain with  popping and occasional swelling, particularly after prolonged squatting while gardening. The pain radiates to her thigh and is described as excruciating when it occurs.  In terms of social history, she quit smoking tobacco in 2021 but smokes marijuana nightly to aid sleep. She consumes about two shots of tequila most evenings to unwind. 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          04/29/2024    8:41 AM 09/06/2023   11:48 AM 01/26/2023    9:02 AM 07/20/2021   10:37 AM 07/17/2020    9:41 AM  Depression screen PHQ 2/9  Decreased Interest 0 0 0 1 0  Down, Depressed, Hopeless 0 0 0 1 0  PHQ - 2 Score 0 0 0 2 0  Altered sleeping  1 2 2    Tired, decreased energy  1 2 1    Change in appetite  1 1 1    Feeling bad or failure about yourself   0 0 0   Trouble concentrating  2 1 2    Moving slowly or fidgety/restless  0 0 1   Suicidal thoughts  0 0 0   PHQ-9 Score  5 6 9    Difficult doing work/chores  Somewhat difficult Not difficult at all Somewhat difficult          07/20/2021    9:29 AM 07/21/2022    9:09 AM 01/26/2023    9:01 AM 09/06/2023   11:48 AM 04/29/2024    8:40 AM  Fall Risk  Falls in  the past year? 0 1 0 0 0  Was there an injury with Fall? 0 0 0 0 0  Fall Risk Category Calculator 0 1 0 0 0  Fall Risk Category (Retired) Low Low     (RETIRED) Patient Fall Risk Level Low fall risk      Patient at Risk for Falls Due to No Fall Risks  No Fall Risks No Fall Risks No Fall Risks  Fall risk Follow up Falls evaluation completed  Falls evaluation completed Falls evaluation completed      Current Outpatient Medications on File Prior to Visit  Medication Sig Dispense Refill   Albuterol-Budesonide (AIRSUPRA ) 90-80 MCG/ACT AERO Inhale 90 mcg into the lungs every 4 (four) hours as needed (for shortness of breath or wheezing). 10.7 g 3   ALPRAZolam  (XANAX ) 0.25 MG tablet Take 1 tablet (0.25 mg total) by mouth daily as needed.  for anxiety 30 tablet 0   fluticasone  (FLONASE ) 50 MCG/ACT nasal spray SPRAY 2 SPRAYS INTO EACH NOSTRIL EVERY DAY 16 mL 11   loratadine  (CLARITIN ) 10 MG tablet Take 1 tablet (10 mg total) by mouth daily as needed. 90 tablet 3   No current facility-administered medications on file prior to visit.  . Social History   Socioeconomic History   Marital status: Married    Spouse name: Not on file   Number of children: Not on file   Years of education: Not on file   Highest education level: Not on file  Occupational History   Not on file  Tobacco Use   Smoking status: Former    Current packs/day: 0.25    Types: Cigarettes   Smokeless tobacco: Never  Vaping Use   Vaping status: Never Used  Substance and Sexual Activity   Alcohol use: Yes    Alcohol/week: 2.0 standard drinks of alcohol    Types: 2 Glasses of wine per week   Drug use: No   Sexual activity: Yes    Birth control/protection: Surgical, Other-see comments    Comment: vastormy  Other Topics Concern   Not on file  Social History Narrative   Married since 2010    No kids    Works at [employer]    No guns    Wears seat belt    Safe in relationship    Social Drivers of Health   Financial Resource Strain: Low Risk  (04/29/2024)   Overall Financial Resource Strain (CARDIA)    Difficulty of Paying Living Expenses: Not hard at all  Food Insecurity: No Food Insecurity (04/29/2024)   Hunger Vital Sign    Worried About Running Out of Food in the Last Year: Never true    Ran Out of Food in the Last Year: Never true  Transportation Needs: No Transportation Needs (04/29/2024)   PRAPARE - Administrator, Civil Service (Medical): No    Lack of Transportation (Non-Medical): No  Physical Activity: Inactive (04/29/2024)   Exercise Vital Sign    Days of Exercise per Week: 0 days    Minutes of Exercise per Session: 0 min  Stress: Stress Concern Present (04/29/2024)   [employer] of Occupational Health -  Occupational Stress Questionnaire    Feeling of Stress : To some extent  Social Connections: Socially Isolated (04/29/2024)   Social Connection and Isolation Panel [NHANES]    Frequency of Communication with Friends and Family: Once a week    Frequency of Social Gatherings with Friends and Family: Once a week  Attends Religious Services: Never    Active Member of Clubs or Organizations: No    Attends Banker Meetings: Never    Marital Status: Married   Past Medical History:  Diagnosis Date   Annual physical exam 07/18/2019   Anxiety    Asthma    COVID-19    12/2020   Dizziness 10/25/2018   GAD (generalized anxiety disorder) 05/28/2018   History of cervical dysplasia    s/p cryotherapy 8 years ago   History of genital warts    Hx of migraines    teenager    Seizure (HCC)    grand mal   Tingling in extremities 10/25/2018   UTI (urinary tract infection)    Family History  Problem Relation Age of Onset   Asthma Mother    Breast cancer Maternal Grandmother    Cancer Maternal Grandmother        breast   Stroke Maternal Grandmother    Heart disease Paternal Grandfather    Cancer Maternal Aunt    Cervical cancer Other    Colon cancer Neg Hx    Rectal cancer Neg Hx    Esophageal cancer Neg Hx    Pancreatic cancer Neg Hx    Liver cancer Neg Hx     Review of Systems  Constitutional:  Positive for fatigue. Negative for chills and fever.  HENT:  Negative for congestion, ear pain, sinus pressure and sore throat.   Respiratory:  Negative for cough and shortness of breath.   Cardiovascular:  Negative for chest pain.  Gastrointestinal:  Negative for abdominal pain, constipation, diarrhea, nausea and vomiting.  Genitourinary:  Negative for dysuria and frequency.  Musculoskeletal:  Positive for arthralgias (left knee pain). Negative for back pain and myalgias.  Neurological:  Negative for dizziness and headaches.  Psychiatric/Behavioral:  Negative for dysphoric mood.  The patient is not nervous/anxious.      Objective:  BP 102/68   Pulse 83   Temp 97.6 F (36.4 C)   Ht 5' 2.25" (1.581 m)   Wt 163 lb 3.2 oz (74 kg)   SpO2 98%   BMI 29.61 kg/m      04/29/2024    8:37 AM 09/06/2023   11:43 AM 01/26/2023    8:58 AM  BP/Weight  Systolic BP 102 130 114  Diastolic BP 68 64 74  Wt. (Lbs) 163.2 158.38 156  BMI 29.61 kg/m2 28.73 kg/m2 28.08 kg/m2    Physical Exam Vitals and nursing note reviewed.  Constitutional:      Appearance: Normal appearance.  HENT:     Head: Normocephalic and atraumatic.  Eyes:     Pupils: Pupils are equal, round, and reactive to light.  Cardiovascular:     Rate and Rhythm: Normal rate and regular rhythm.  Pulmonary:     Effort: Pulmonary effort is normal.     Breath sounds: Normal breath sounds.  Musculoskeletal:        General: No swelling or tenderness. Normal range of motion.     Cervical back: Normal range of motion.     Comments: Left knee without grinding, no pain on palpation of popliteal region, fullness in the back of the left knee.  Neurological:     Mental Status: She is alert.    Diabetic Foot Exam - Simple   No data filed      Lab Results  Component Value Date   WBC 5.7 07/21/2022   HGB 13.1 07/21/2022   HCT 39.7 07/21/2022  PLT 320.0 07/21/2022   GLUCOSE 85 07/21/2022   CHOL 191 07/21/2022   TRIG 74.0 07/21/2022   HDL 67.40 07/21/2022   LDLCALC 109 (H) 07/21/2022   ALT 11 07/21/2022   AST 11 07/21/2022   NA 137 07/21/2022   K 4.1 07/21/2022   CL 101 07/21/2022   CREATININE 0.62 07/21/2022   BUN 12 07/21/2022   CO2 28 07/21/2022   TSH 0.78 07/21/2022      Assessment & Plan:  Vitamin D  deficiency Assessment & Plan: Will order labs and make recommendations  Orders: -     VITAMIN D  25 Hydroxy (Vit-D Deficiency, Fractures)  Hyperlipidemia, unspecified hyperlipidemia type Assessment & Plan: Elevated cholesterol managed with lifestyle modifications. No current medication as  previous levels were not high enough to warrant pharmacotherapy. Encouraged to work on diet and exercise to manage cholesterol levels.  Orders: -     Comprehensive metabolic panel with GFR -     Lipid panel  Fatigue, unspecified type Assessment & Plan: Reports chronic fatigue Will order labs to rule out anemia, hypothyroidism, vitamin d  and b12 deficiency Recommend daily exercise.   Orders: -     CBC with Differential/Platelet -     T4, free -     TSH -     Vitamin B12  Acute pain of left knee Assessment & Plan: Left knee pain with popping and occasional swelling, possibly due to a Baker's cyst. Pain occurs after prolonged squatting. No signs of joint or tendon injury on examination. Recommended activity modification and symptomatic treatment. - Advise against prolonged squatting - Recommend heat or ice application for pain - Suggest taking ibuprofen  if needed - Consider x-ray if pain persists    History of seizures Assessment & Plan:  She has a history of seizures, with the first occurring in her early twenties and the last one in 2022. The seizures were attributed to stress, and she has not been on any seizure medications. She underwent a comprehensive workup including EEGs, MRIs, and a sleep-deprived EEG, all of which were unremarkable. Her seizures included a grand mal seizure and another episode characterized by weakness and shaking. She has not experienced any seizures in the past three years.  Seizures, last occurred in 2022. No current medication as seizures are rare and stress-related. Previous workup including EEG and MRI was unremarkable. Advised to monitor for any recurrence and to contact if similar events occur. Xanax  is appropriate to have on hand as benzodiazepines can be used for seizure management.   Health care maintenance Assessment & Plan: Discussed lifestyle modifications including diet and exercise. Addressed alcohol consumption and its potential impact on  liver health and risk of fatty liver disease. Encouraged reduction in alcohol intake to one drink per day for women. Discussed smoking cessation and use of marijuana for sleep, recommending CBD gummies to reduce inhalation risks, especially with asthma. Emphasized the importance of regular Pap smears, particularly with a history of cervical dysplasia. - Order basic blood work - Schedule wellness exam with Pap smear in 6-8 weeks - Encourage reduction of alcohol intake to one drink per day - Advise considering CBD gummies instead of smoking marijuana - Encourage diet and exercise modifications     Assessment and Plan            No orders of the defined types were placed in this encounter.  Orders Placed This Encounter  Procedures   CBC with Differential   Comprehensive metabolic panel with GFR   Lipid  Panel   Vitamin D , 25-hydroxy   T4, free   TSH   Vitamin B12      Follow-up: Return for wellness exam.  AVS was given to patient prior to departure.  [facility] [phone]

## 2024-04-29 NOTE — Assessment & Plan Note (Signed)
  She has a history of seizures, with the first occurring in her early twenties and the last one in 2022. The seizures were attributed to stress, and she has not been on any seizure medications. She underwent a comprehensive workup including EEGs, MRIs, and a sleep-deprived EEG, all of which were unremarkable. Her seizures included a grand mal seizure and another episode characterized by weakness and shaking. She has not experienced any seizures in the past three years.  Seizures, last occurred in 2022. No current medication as seizures are rare and stress-related. Previous workup including EEG and MRI was unremarkable. Advised to monitor for any recurrence and to contact if similar events occur. Xanax  is appropriate to have on hand as benzodiazepines can be used for seizure management.

## 2024-04-29 NOTE — Patient Instructions (Signed)
  VISIT SUMMARY:  Today, you came in for a routine wellness visit to establish care and review your medications. We discussed your history of seizures, asthma, panic attacks, hyperlipidemia, cervical dysplasia, and left knee pain. We also talked about lifestyle modifications, including diet, exercise, and reducing alcohol intake.  YOUR PLAN:  Today, you had a visit to establish care and discuss lifestyle modifications. -Order basic blood work. -Schedule wellness exam with Pap smear in 6-8 weeks. -Encourage reduction of alcohol intake to one drink per day. -Advise considering CBD gummies instead of smoking marijuana. -Encourage diet and exercise modifications.  CERVICAL DYSPLASIA: History of cervical dysplasia with previous abnormal Pap smears. Due for a Pap smear. -Schedule Pap smear during wellness exam in 6-8 weeks.  ELEVATED CHOLESTEROL: Elevated cholesterol managed with lifestyle modifications. No current medication needed. -Work on diet and exercise to manage cholesterol levels.  MILD INTERMITTENT ASTHMA: Mild intermittent asthma managed with Airsupra  inhaler as needed. No recent exacerbations. -Continue using Airsupra  inhaler as needed.  ALLERGIC RHINITIS: Allergic rhinitis managed with Flonase  and Claritin . -Continue using Flonase  and Claritin  as needed.  PANIC ATTACKS: Occasional panic attacks, often associated with menstrual cycle. Managed with Xanax  as needed. -Refill Xanax  as needed when down to last 2-3 pills.  SEIZURES: Seizures, last occurred in 2022. No current medication needed as seizures are rare and stress-related. -Monitor for any recurrence and contact us  if similar events occur. -Xanax  is appropriate to have on hand as benzodiazepines can be used for seizure management.  POSSIBLE BAKER'S CYST: Left knee pain with popping and occasional swelling, possibly due to a Baker's cyst. -Advise against prolonged squatting. -Recommend heat or ice application for  pain. -Suggest taking ibuprofen  if needed. -Consider x-ray if pain persists.                      Contains text generated by [employer]bridge.                                 Contains text generated by [employer]bridge.

## 2024-04-29 NOTE — Assessment & Plan Note (Signed)
 Left knee pain with popping and occasional swelling, possibly due to a Baker's cyst. Pain occurs after prolonged squatting. No signs of joint or tendon injury on examination. Recommended activity modification and symptomatic treatment. - Advise against prolonged squatting - Recommend heat or ice application for pain - Suggest taking ibuprofen  if needed - Consider x-ray if pain persists

## 2024-04-29 NOTE — Assessment & Plan Note (Signed)
 Elevated cholesterol managed with lifestyle modifications. No current medication as previous levels were not high enough to warrant pharmacotherapy. Encouraged to work on diet and exercise to manage cholesterol levels.

## 2024-04-29 NOTE — Assessment & Plan Note (Signed)
 Discussed lifestyle modifications including diet and exercise. Addressed alcohol consumption and its potential impact on liver health and risk of fatty liver disease. Encouraged reduction in alcohol intake to one drink per day for women. Discussed smoking cessation and use of marijuana for sleep, recommending CBD gummies to reduce inhalation risks, especially with asthma. Emphasized the importance of regular Pap smears, particularly with a history of cervical dysplasia. - Order basic blood work - Schedule wellness exam with Pap smear in 6-8 weeks - Encourage reduction of alcohol intake to one drink per day - Advise considering CBD gummies instead of smoking marijuana - Encourage diet and exercise modifications

## 2024-04-29 NOTE — Assessment & Plan Note (Signed)
 Will order labs and make recommendations

## 2024-04-29 NOTE — Assessment & Plan Note (Signed)
 Reports chronic fatigue Will order labs to rule out anemia, hypothyroidism, vitamin d  and b12 deficiency Recommend daily exercise.

## 2024-04-30 ENCOUNTER — Ambulatory Visit: Payer: Self-pay

## 2024-04-30 LAB — COMPREHENSIVE METABOLIC PANEL WITH GFR
ALT: 15 IU/L (ref 0–32)
AST: 14 IU/L (ref 0–40)
Albumin: 4.4 g/dL (ref 3.9–4.9)
Alkaline Phosphatase: 84 IU/L (ref 44–121)
BUN/Creatinine Ratio: 14 (ref 9–23)
BUN: 10 mg/dL (ref 6–20)
Bilirubin Total: 0.5 mg/dL (ref 0.0–1.2)
CO2: 21 mmol/L (ref 20–29)
Calcium: 9.3 mg/dL (ref 8.7–10.2)
Chloride: 100 mmol/L (ref 96–106)
Creatinine, Ser: 0.7 mg/dL (ref 0.57–1.00)
Globulin, Total: 2.2 g/dL (ref 1.5–4.5)
Glucose: 96 mg/dL (ref 70–99)
Potassium: 4.5 mmol/L (ref 3.5–5.2)
Sodium: 136 mmol/L (ref 134–144)
Total Protein: 6.6 g/dL (ref 6.0–8.5)
eGFR: 113 mL/min/{1.73_m2} (ref >59)

## 2024-04-30 LAB — LIPID PANEL
Chol/HDL Ratio: 2.3 ratio (ref 0.0–4.4)
Cholesterol, Total: 189 mg/dL (ref 100–199)
HDL: 84 mg/dL (ref >39)
LDL Chol Calc (NIH): 89 mg/dL (ref 0–99)
Triglycerides: 90 mg/dL (ref 0–149)
VLDL Cholesterol Cal: 16 mg/dL (ref 5–40)

## 2024-04-30 LAB — CBC WITH DIFFERENTIAL/PLATELET
Basophils Absolute: 0.1 10*3/uL (ref 0.0–0.2)
Basos: 1 %
EOS (ABSOLUTE): 0.2 10*3/uL (ref 0.0–0.4)
Eos: 3 %
Hematocrit: 42.7 % (ref 34.0–46.6)
Hemoglobin: 13.8 g/dL (ref 11.1–15.9)
Immature Grans (Abs): 0 10*3/uL (ref 0.0–0.1)
Immature Granulocytes: 0 %
Lymphocytes Absolute: 2.1 10*3/uL (ref 0.7–3.1)
Lymphs: 24 %
MCH: 30.7 pg (ref 26.6–33.0)
MCHC: 32.3 g/dL (ref 31.5–35.7)
MCV: 95 fL (ref 79–97)
Monocytes Absolute: 0.5 10*3/uL (ref 0.1–0.9)
Monocytes: 6 %
Neutrophils Absolute: 5.7 10*3/uL (ref 1.4–7.0)
Neutrophils: 66 %
Platelets: 299 10*3/uL (ref 150–450)
RBC: 4.5 x10E6/uL (ref 3.77–5.28)
RDW: 12.1 % (ref 11.7–15.4)
WBC: 8.7 10*3/uL (ref 3.4–10.8)

## 2024-04-30 LAB — T4, FREE: Free T4: 1.2 ng/dL (ref 0.82–1.77)

## 2024-04-30 LAB — VITAMIN B12: Vitamin B-12: 372 pg/mL (ref 232–1245)

## 2024-04-30 LAB — VITAMIN D 25 HYDROXY (VIT D DEFICIENCY, FRACTURES): Vit D, 25-Hydroxy: 32.3 ng/mL (ref 30.0–100.0)

## 2024-04-30 LAB — TSH: TSH: 1.39 u[IU]/mL (ref 0.450–4.500)

## 2024-05-30 ENCOUNTER — Other Ambulatory Visit: Payer: Self-pay

## 2024-05-30 DIAGNOSIS — F39 Unspecified mood [affective] disorder: Secondary | ICD-10-CM

## 2024-05-30 MED ORDER — ALPRAZOLAM 0.25 MG PO TABS: 0.2500 mg | ORAL_TABLET | Freq: Every day | ORAL | 0 refills | Status: AC | PRN

## 2024-05-30 NOTE — Addendum Note (Signed)
 Addended by: Aster Screws on: 05/30/2024 04:37 PM   Modules accepted: Orders

## 2024-05-30 NOTE — Telephone Encounter (Signed)
 Rx pended

## 2024-08-06 ENCOUNTER — Ambulatory Visit

## 2024-08-06 ENCOUNTER — Telehealth (INDEPENDENT_AMBULATORY_CARE_PROVIDER_SITE_OTHER)

## 2024-08-06 VITALS — BP 128/86 | HR 107 | Ht 62.25 in | Wt 166.0 lb

## 2024-08-06 DIAGNOSIS — Z0000 Encounter for general adult medical examination without abnormal findings: Secondary | ICD-10-CM

## 2024-08-06 DIAGNOSIS — E6609 Other obesity due to excess calories: Secondary | ICD-10-CM | POA: Diagnosis not present

## 2024-08-06 DIAGNOSIS — Z7689 Persons encountering health services in other specified circumstances: Secondary | ICD-10-CM

## 2024-08-06 DIAGNOSIS — E66811 Obesity, class 1: Secondary | ICD-10-CM

## 2024-08-06 DIAGNOSIS — Z6830 Body mass index (BMI) 30.0-30.9, adult: Secondary | ICD-10-CM | POA: Diagnosis not present

## 2024-08-06 MED ORDER — LISDEXAMFETAMINE DIMESYLATE 20 MG PO CAPS: 20.0000 mg | ORAL_CAPSULE | Freq: Every day | ORAL | 0 refills | Status: DC

## 2024-08-06 NOTE — Progress Notes (Unsigned)
 Virtual Visit via Video Note   This visit type was conducted per patient request This format is felt to be appropriate for this patient at this time.  All issues noted in this document were discussed and addressed.  A limited physical exam was performed with this format.  A verbal consent was obtained for the virtual visit.   Date:  08/07/2024   ID:  Sarah West, DOB 26-Jan-1986, MRN [id number]  Patient Location: Home Provider Location: Office/Clinic  PCP:  Ezmeralda Stefanick, MD   No chief complaint on file.    History of Present Illness:    The patient does not have symptoms concerning for COVID-19 infection (fever, chills, cough, or new shortness of breath).  Discussed the use of AI scribe software for clinical note transcription with the patient, who gave verbal consent to proceed.  History of Present Illness   Sarah West is a 38 year old female with a history of seizure disorder who presents for weight management.  Weight gain and overweight status - Weight is 166 pounds, reflecting a 3-pound increase since May 2025 - Body mass index (BMI) is 29, categorized as overweight - Consumes 1200 to 1500 calories daily without experiencing hunger or starvation - Engages in physical activity, though specific exercise details are not provided  Seizure disorder - History of seizure disorder - Last seizure occurred in 2022, attributed to stress - No recent seizures reported  Adverse reaction to phentermine  - Allergy to phentermine , previously caused tinnitus, abdominal pain, and increased anxiety  Laboratory findings - Recent blood work including electrolytes, kidney function, cholesterol, and blood sugars is normal       Past Medical History:  Diagnosis Date   Annual physical exam 07/18/2019   Anxiety    Asthma    COVID-19    12/2020   Dizziness 10/25/2018   GAD (generalized anxiety disorder) 05/28/2018   History of cervical dysplasia    s/p cryotherapy 8 years  ago   History of genital warts    Hx of migraines    teenager    Seizure (HCC)    grand mal   Tingling in extremities 10/25/2018   UTI (urinary tract infection)     Past Surgical History:  Procedure Laterality Date   TONSILLECTOMY     2009   WISDOM TOOTH EXTRACTION      Family History  Problem Relation Age of Onset   Asthma Mother    Breast cancer Maternal Grandmother    Cancer Maternal Grandmother        breast   Stroke Maternal Grandmother    Heart disease Paternal Grandfather    Cancer Maternal Aunt    Cervical cancer Other    Colon cancer Neg Hx    Rectal cancer Neg Hx    Esophageal cancer Neg Hx    Pancreatic cancer Neg Hx    Liver cancer Neg Hx     Social History   Socioeconomic History   Marital status: Married    Spouse name: Not on file   Number of children: Not on file   Years of education: Not on file   Highest education level: Associate degree: occupational, Scientist, product/process development, or vocational program  Occupational History   Not on file  Tobacco Use   Smoking status: Former    Current packs/day: 0.25    Types: Cigarettes   Smokeless tobacco: Never  Vaping Use   Vaping status: Never Used  Substance and Sexual Activity   Alcohol use: Yes  Alcohol/week: 2.0 standard drinks of alcohol    Types: 2 Glasses of wine per week   Drug use: No   Sexual activity: Yes    Birth control/protection: Surgical, Other-see comments    Comment: vastormy  Other Topics Concern   Not on file  Social History Narrative   Married since 2010    No kids    Works at [employer]    No guns    Wears seat belt    Safe in relationship    Social Drivers of Health   Financial Resource Strain: Low Risk  (08/06/2024)   Overall Financial Resource Strain (CARDIA)    Difficulty of Paying Living Expenses: Not hard at all  Food Insecurity: No Food Insecurity (08/06/2024)   Hunger Vital Sign    Worried About Running Out of Food in the Last Year: Never true    Ran Out of Food in the  Last Year: Never true  Transportation Needs: No Transportation Needs (08/06/2024)   PRAPARE - Administrator, Civil Service (Medical): No    Lack of Transportation (Non-Medical): No  Physical Activity: Sufficiently Active (08/06/2024)   Exercise Vital Sign    Days of Exercise per Week: 5 days    Minutes of Exercise per Session: 40 min  Stress: No Stress Concern Present (08/06/2024)   [employer] of Occupational Health - Occupational Stress Questionnaire    Feeling of Stress: Only a little  Social Connections: Socially Isolated (08/06/2024)   Social Connection and Isolation Panel    Frequency of Communication with Friends and Family: Once a week    Frequency of Social Gatherings with Friends and Family: Never    Attends Religious Services: Never    Database administrator or Organizations: No    Attends Engineer, structural: Not on file    Marital Status: Married  Catering manager Violence: Not At Risk (04/29/2024)   Humiliation, Afraid, Rape, and Kick questionnaire    Fear of Current or Ex-Partner: No    Emotionally Abused: No    Physically Abused: No    Sexually Abused: No    Outpatient Medications Prior to Visit  Medication Sig Dispense Refill   Albuterol-Budesonide (AIRSUPRA ) 90-80 MCG/ACT AERO Inhale 90 mcg into the lungs every 4 (four) hours as needed (for shortness of breath or wheezing). 10.7 g 3   ALPRAZolam  (XANAX ) 0.25 MG tablet Take 0.25 mg by mouth as needed for anxiety.     fluticasone  (FLONASE ) 50 MCG/ACT nasal spray SPRAY 2 SPRAYS INTO EACH NOSTRIL EVERY DAY 16 mL 11   loratadine  (CLARITIN ) 10 MG tablet Take 1 tablet (10 mg total) by mouth daily as needed. 90 tablet 3   No facility-administered medications prior to visit.    Allergies  Allergen Reactions   Adipex-P  [Phentermine ]     Ringing/dizziness      Social History   Tobacco Use   Smoking status: Former    Current packs/day: 0.25    Types: Cigarettes   Smokeless tobacco: Never   Vaping Use   Vaping status: Never Used  Substance Use Topics   Alcohol use: Yes    Alcohol/week: 2.0 standard drinks of alcohol    Types: 2 Glasses of wine per week   Drug use: No     Review of Systems  Constitutional:  Negative for fever and weight loss.       Weight gain  HENT:  Negative for congestion.   Respiratory:  Negative for cough.  Cardiovascular:  Negative for chest pain and leg swelling.  Gastrointestinal:  Negative for diarrhea, nausea and vomiting.  Musculoskeletal:  Negative for joint pain and myalgias.  Neurological:  Negative for headaches.  Psychiatric/Behavioral:  Negative for depression.      Labs/Other Tests and Data Reviewed:    Recent Labs: 04/29/2024: ALT 15; BUN 10; Creatinine, Ser 0.70; Hemoglobin 13.8; Platelets 299; Potassium 4.5; Sodium 136; TSH 1.390   Recent Lipid Panel Lab Results  Component Value Date/Time   CHOL 189 04/29/2024 09:21 AM   TRIG 90 04/29/2024 09:21 AM   HDL 84 04/29/2024 09:21 AM   CHOLHDL 2.3 04/29/2024 09:21 AM   CHOLHDL 3 07/21/2022 09:46 AM   LDLCALC 89 04/29/2024 09:21 AM    Wt Readings from Last 3 Encounters:  08/06/24 166 lb (75.3 kg)  04/29/24 163 lb 3.2 oz (74 kg)  09/06/23 158 lb 6 oz (71.8 kg)     Objective:    Vital Signs:  BP 128/86   Pulse (!) 107 Comment: just got done working out  Ht 5' 2.25 (1.581 m)   Wt 166 lb (75.3 kg)   BMI 30.12 kg/m    Physical Exam Vitals reviewed.  Constitutional:      Appearance: She is obese.  Neurological:     Mental Status: She is alert and oriented to person, place, and time.      ASSESSMENT & PLAN:   Encounter for weight management  Class 1 obesity due to excess calories without serious comorbidity with body mass index (BMI) of 30.0 to 30.9 in adult Assessment & Plan: BMI of 30 indicating class 1 obesity.   Weight increased by 3 pounds since May. Blood work, including electrolytes, kidney function, cholesterol, and blood sugars, is normal, indicating no  diabetes or hyperlipidemia. Seizure disorder limits medication options for weight management. Phentermine  and Wellbutrin are contraindicated due to side effects and seizure history, respectively.  Topamax could be an option, though she denies any binge eating disorder. HER MAIN CONCERN IS HER EXCESSIVE HUNGER AND FOOD THOUGHTS.  Injectable medications like Ozempic and Wegovy are not covered due to lack of comorbidities. Vyvanse  is considered, but it is primarily for binge eating disorder and may suppress appetite excessively given current caloric intake. Discussed that Vyvanse  is a controlled substance with specific regulations and potential side effects, including appetite suppression, which could lead to inadequate caloric intake. - Prescribe Vyvanse  for 30 days, to be taken in the morning. - Advise use of GoodRx coupon if not covered by insurance. - Encourage maintaining caloric intake between 1200-1500 calories daily. - Recommend 45-60 minutes of moderate intensity physical activity 5-7 times a week and strength training a couple of times a week. - Advise against post-dinner snacking and ensure adequate sleep. - Schedule follow-up towards the end of the month to assess progress with Vyvanse .    Other orders -     Lisdexamfetamine Dimesylate ; Take 1 capsule (20 mg total) by mouth daily.  Dispense: 30 capsule; Refill: 0     No orders of the defined types were placed in this encounter.   Total time spent on today's visit was  30 minutes, including both face-to-face time and nonface-to-face time personally spent on review of chart (labs and imaging), discussing labs and goals, discussing further work-up, treatment options, referrals to specialist if needed, reviewing outside records of pertinent, answering patient's questions, and coordinating care.  Meds ordered this encounter  Medications   lisdexamfetamine (VYVANSE ) 20 MG capsule    Sig:  Take 1 capsule (20 mg total) by mouth daily.     Dispense:  30 capsule    Refill:  0    If not covered by insurance, please use Good Rx coupon   Assessment and Plan            Follow Up:  In Person in 2 month(s)  Signed, Tommy Schimke, MD  08/07/2024 11:56 AM    [facility]

## 2024-08-07 DIAGNOSIS — E6609 Other obesity due to excess calories: Secondary | ICD-10-CM | POA: Insufficient documentation

## 2024-08-07 NOTE — Assessment & Plan Note (Signed)
 BMI of 30 indicating class 1 obesity.   Weight increased by 3 pounds since May. Blood work, including electrolytes, kidney function, cholesterol, and blood sugars, is normal, indicating no diabetes or hyperlipidemia. Seizure disorder limits medication options for weight management. Phentermine  and Wellbutrin are contraindicated due to side effects and seizure history, respectively.  Topamax could be an option, though she denies any binge eating disorder. HER MAIN CONCERN IS HER EXCESSIVE HUNGER AND FOOD THOUGHTS.  Injectable medications like Ozempic and Wegovy are not covered due to lack of comorbidities. Vyvanse  is considered, but it is primarily for binge eating disorder and may suppress appetite excessively given current caloric intake. Discussed that Vyvanse  is a controlled substance with specific regulations and potential side effects, including appetite suppression, which could lead to inadequate caloric intake. - Prescribe Vyvanse  for 30 days, to be taken in the morning. - Advise use of GoodRx coupon if not covered by insurance. - Encourage maintaining caloric intake between 1200-1500 calories daily. - Recommend 45-60 minutes of moderate intensity physical activity 5-7 times a week and strength training a couple of times a week. - Advise against post-dinner snacking and ensure adequate sleep. - Schedule follow-up towards the end of the month to assess progress with Vyvanse .

## 2024-08-12 ENCOUNTER — Ambulatory Visit

## 2024-08-12 MED ORDER — BUDESONIDE-FORMOTEROL FUMARATE 160-4.5 MCG/ACT IN AERO
2.0000 | INHALATION_SPRAY | Freq: Two times a day (BID) | RESPIRATORY_TRACT | 3 refills | Status: DC
Start: 2024-08-12 — End: 2024-10-02

## 2024-08-23 ENCOUNTER — Other Ambulatory Visit: Payer: Self-pay | Admitting: Medical Genetics

## 2024-09-05 ENCOUNTER — Other Ambulatory Visit: Payer: Managed Care, Other (non HMO)

## 2024-09-30 ENCOUNTER — Other Ambulatory Visit: Payer: Self-pay

## 2024-09-30 MED ORDER — ALPRAZOLAM 0.25 MG PO TABS: 0.2500 mg | ORAL_TABLET | ORAL | 0 refills | Status: DC | PRN

## 2024-10-02 ENCOUNTER — Other Ambulatory Visit: Payer: Self-pay

## 2024-10-02 DIAGNOSIS — F39 Unspecified mood [affective] disorder: Secondary | ICD-10-CM

## 2024-10-02 MED ORDER — ALPRAZOLAM 0.25 MG PO TABS: 0.2500 mg | ORAL_TABLET | ORAL | 0 refills | Status: DC | PRN

## 2024-10-17 ENCOUNTER — Ambulatory Visit (INDEPENDENT_AMBULATORY_CARE_PROVIDER_SITE_OTHER)

## 2024-10-17 VITALS — BP 122/84 | HR 68 | Temp 97.5°F | Ht 62.25 in | Wt 158.8 lb

## 2024-10-17 DIAGNOSIS — J309 Allergic rhinitis, unspecified: Secondary | ICD-10-CM | POA: Diagnosis not present

## 2024-10-17 DIAGNOSIS — F419 Anxiety disorder, unspecified: Secondary | ICD-10-CM | POA: Insufficient documentation

## 2024-10-17 DIAGNOSIS — Z0000 Encounter for general adult medical examination without abnormal findings: Secondary | ICD-10-CM | POA: Insufficient documentation

## 2024-10-17 MED ORDER — ALPRAZOLAM 0.25 MG PO TABS: 0.2500 mg | ORAL_TABLET | ORAL | 0 refills | Status: AC | PRN

## 2024-10-17 MED ORDER — MONTELUKAST SODIUM 10 MG PO TABS: 10.0000 mg | ORAL_TABLET | Freq: Every day | ORAL | 0 refills | Status: AC

## 2024-10-17 MED ORDER — FLUTICASONE PROPIONATE 50 MCG/ACT NA SUSP: NASAL | 11 refills | Status: AC

## 2024-10-17 NOTE — Patient Instructions (Signed)
  VISIT SUMMARY: Today, we addressed your right ear pain, allergies, and anxiety management. We also discussed your weight loss progress and general health maintenance.  YOUR PLAN: CHRONIC ANXIETY: You have chronic anxiety and use alprazolam  as needed. -You have been prescribed 15 tablets of alprazolam  0.25 mg for use as needed. -Use the medication sparingly and mindfully.  ALLERGIC RHINITIS WITH EUSTACHIAN TUBE DYSFUNCTION AND MIDDLE EAR EFFUSION, RIGHT: You have intermittent right ear pain with fluid behind the eardrum, likely due to allergies. -Switch from Claritin  to Allegra (fexofenadine) for a few weeks. -Continue using Flonase , one squirt in each nostril daily. -Consider Singulair  (montelukast ) if there is no improvement with Allegra. -Use Tylenol or ibuprofen  for pain management. -Perform steam inhalation to help clear your eustachian tubes. -Use a mini pot for drainage relief.  OVERWEIGHT: Your BMI has reduced to 27.5 with a weight loss of 8-13 pounds. -Continue your current exercise regimen and dietary modifications. -Monitor your BMI and body fat percentage.  GENERAL HEALTH MAINTENANCE: Routine health maintenance was discussed. -Schedule your next Pap smear for May 2026. -Continue regular dental check-ups. -Maintain a healthy lifestyle with exercise and diet.                      Contains text generated by [employer]bridge.                                 Contains text generated by [employer]bridge.

## 2024-10-17 NOTE — Progress Notes (Signed)
 Subjective:  Patient ID: Sarah West, female    DOB: 1986-05-18  Age: 38 y.o. MRN: [id number]  Chief Complaint  Patient presents with   Annual Exam    Well Adult Physical: Patient here for a comprehensive physical exam AND A PROBLEM.The patient reports problems - right ear ache Do you take any herbs or supplements that were not prescribed by a doctor? no Are you taking calcium supplements? no Are you taking aspirin daily? no  Encounter for general adult medical examination without abnormal findings  Physical (At Risk items are starred): Patient's last physical exam was 1 year ago .  Patient is not afflicted from Stress Incontinence and Urge Incontinence  Patient wears a seat belts Patient has smoke detectors and has carbon monoxide detectors. Patient practices appropriate gun safety. Patient wears sunscreen with extended sun exposure. Dental Care: biannual cleanings, brushes and flosses daily. Ophthalmology/Optometry: Annual visit.  Hearing loss: none Vision impairments: none   Menstrual History: regular  LMP: 09/19/2024 Pregnancy history: G0P0 Safe at home: yes Self breast exams: yes  History of Present Illness   Sarah West is a 38 year old female who presents with right ear pain and medication management.  Right otalgia and ear symptoms - Intermittent right ear pain for the past month - Associated ear fluttering - Suspects symptoms may be related to ear wax buildup - Pain recurred this week - Uses Benadryl and ibuprofen  for symptom management - Associated swelling in the neck glands - Pain is not constant but is severe when present  Allergic rhinitis and upper respiratory symptoms - Uses Flonase  daily and Claritin  regularly for allergies - Increased cough and drainage - No significant nasal congestion - Previously trialed Allegra and Zyrtec   Anxiety symptoms and benzodiazepine use - Chronic anxiety - Prescribed alprazolam  0.25 mg for breakthrough  anxiety, used sparingly - Last alprazolam  refill on June 12 - Unable to obtain refill due to prescription issues - Having medication available provides reassurance         10/17/2024    8:47 AM 08/06/2024    2:04 PM 04/29/2024    8:41 AM 09/06/2023   11:48 AM 01/26/2023    9:02 AM  Depression screen PHQ 2/9  Decreased Interest 0 0 0 0 0  Down, Depressed, Hopeless 0 0 0 0 0  PHQ - 2 Score 0 0 0 0 0  Altered sleeping 3   1 2   Tired, decreased energy 1   1 2   Change in appetite 0   1 1  Feeling bad or failure about yourself  0   0 0  Trouble concentrating 0   2 1  Moving slowly or fidgety/restless 0   0 0  Suicidal thoughts 0   0 0  PHQ-9 Score 4   5 6   Difficult doing work/chores Not difficult at all   Somewhat difficult Not difficult at all         01/26/2023    9:01 AM 09/06/2023   11:48 AM 04/29/2024    8:40 AM 08/06/2024    2:04 PM 10/17/2024    8:47 AM  Fall Risk  Falls in the past year? 0 0 0 0 1  Was there an injury with Fall? 0 0 0 0 0  Fall Risk Category Calculator 0 0 0 0 1  Patient at Risk for Falls Due to No Fall Risks No Fall Risks No Fall Risks No Fall Risks No Fall Risks  Fall risk Follow up Falls evaluation completed  Falls evaluation completed  Falls evaluation completed Falls evaluation completed             Social Hx   Social History   Socioeconomic History   Marital status: Married    Spouse name: Not on file   Number of children: Not on file   Years of education: Not on file   Highest education level: Associate degree: occupational, scientist, product/process development, or vocational program  Occupational History   Not on file  Tobacco Use   Smoking status: Former    Current packs/day: 0.25    Types: Cigarettes   Smokeless tobacco: Never  Vaping Use   Vaping status: Never Used  Substance and Sexual Activity   Alcohol use: Yes    Alcohol/week: 2.0 standard drinks of alcohol    Types: 2 Glasses of wine per week   Drug use: No   Sexual activity: Yes    Birth  control/protection: Surgical, Other-see comments    Comment: vastormy  Other Topics Concern   Not on file  Social History Narrative   Married since 2010    No kids    Works at [employer]    No guns    Wears seat belt    Safe in relationship    Social Drivers of Health   Financial Resource Strain: Low Risk  (10/16/2024)   Overall Financial Resource Strain (CARDIA)    Difficulty of Paying Living Expenses: Not hard at all  Food Insecurity: No Food Insecurity (10/16/2024)   Hunger Vital Sign    Worried About Running Out of Food in the Last Year: Never true    Ran Out of Food in the Last Year: Never true  Transportation Needs: No Transportation Needs (10/16/2024)   PRAPARE - Administrator, Civil Service (Medical): No    Lack of Transportation (Non-Medical): No  Physical Activity: Sufficiently Active (10/16/2024)   Exercise Vital Sign    Days of Exercise per Week: 6 days    Minutes of Exercise per Session: 50 min  Stress: Stress Concern Present (10/16/2024)   [employer] of Occupational Health - Occupational Stress Questionnaire    Feeling of Stress: To some extent  Social Connections: Socially Isolated (10/16/2024)   Social Connection and Isolation Panel    Frequency of Communication with Friends and Family: Once a week    Frequency of Social Gatherings with Friends and Family: Never    Attends Religious Services: Never    Database Administrator or Organizations: No    Attends Engineer, Structural: Not on file    Marital Status: Married   Past Medical History:  Diagnosis Date   Annual physical exam 07/18/2019   Anxiety    Asthma    COVID-19    12/2020   Dizziness 10/25/2018   GAD (generalized anxiety disorder) 05/28/2018   History of cervical dysplasia    s/p cryotherapy 8 years ago   History of genital warts    Hx of migraines    teenager    Seizure (HCC)    grand mal   Tingling in extremities 10/25/2018   UTI (urinary tract  infection)    Past Surgical History:  Procedure Laterality Date   TONSILLECTOMY     2009   WISDOM TOOTH EXTRACTION      Family History  Problem Relation Age of Onset   Asthma Mother    Breast cancer Maternal Grandmother    Cancer Maternal Grandmother        breast  Stroke Maternal Grandmother    Heart disease Paternal Grandfather    Cancer Maternal Aunt    Cervical cancer Other    Colon cancer Neg Hx    Rectal cancer Neg Hx    Esophageal cancer Neg Hx    Pancreatic cancer Neg Hx    Liver cancer Neg Hx     Review of Systems  Constitutional:  Negative for chills, fatigue and fever.  HENT:  Positive for ear pain. Negative for congestion, sinus pressure and sore throat.   Respiratory:  Negative for cough and shortness of breath.   Cardiovascular:  Negative for chest pain.  Gastrointestinal:  Negative for abdominal pain, constipation, diarrhea, nausea and vomiting.  Genitourinary:  Negative for dysuria and frequency.  Musculoskeletal:  Negative for arthralgias, back pain and myalgias.  Neurological:  Negative for dizziness and headaches.  Psychiatric/Behavioral:  Negative for dysphoric mood. The patient is nervous/anxious.      Objective:  BP 122/84   Pulse 68   Temp (!) 97.5 F (36.4 C)   Ht 5' 2.25 (1.581 m)   Wt 158 lb 12.8 oz (72 kg)   LMP 09/19/2024 (Exact Date)   SpO2 98%   BMI 28.81 kg/m      10/17/2024    8:44 AM 08/06/2024    2:02 PM 04/29/2024    8:37 AM  BP/Weight  Systolic BP 122 128 102  Diastolic BP 84 86 68  Wt. (Lbs) 158.8 166 163.2  BMI 28.81 kg/m2 30.12 kg/m2 29.61 kg/m2    Physical Exam Vitals and nursing note reviewed.  Constitutional:      Appearance: Normal appearance.  HENT:     Head: Normocephalic and atraumatic.     Ears:     Comments: Effusion noted behind both TM Eyes:     Extraocular Movements: Extraocular movements intact.     Pupils: Pupils are equal, round, and reactive to light.  Cardiovascular:     Rate and Rhythm:  Normal rate and regular rhythm.  Pulmonary:     Effort: Pulmonary effort is normal.     Breath sounds: Normal breath sounds.  Musculoskeletal:        General: Normal range of motion.     Cervical back: Normal range of motion.  Neurological:     General: No focal deficit present.     Mental Status: She is alert and oriented to person, place, and time.  Psychiatric:        Mood and Affect: Mood normal.        Behavior: Behavior normal.     Lab Results  Component Value Date   WBC 8.7 04/29/2024   HGB 13.8 04/29/2024   HCT 42.7 04/29/2024   PLT 299 04/29/2024   GLUCOSE 96 04/29/2024   CHOL 189 04/29/2024   TRIG 90 04/29/2024   HDL 84 04/29/2024   LDLCALC 89 04/29/2024   ALT 15 04/29/2024   AST 14 04/29/2024   NA 136 04/29/2024   K 4.5 04/29/2024   CL 100 04/29/2024   CREATININE 0.70 04/29/2024   BUN 10 04/29/2024   CO2 21 04/29/2024   TSH 1.390 04/29/2024      Assessment & Plan:   Well adult exam Assessment & Plan: General Health Maintenance Routine health maintenance discussed. Pap smear not needed until May 2026. Eye exam completed earlier this year. Dental visit due in December. No vaccines desired due to personal preference and past experiences. - Will schedule Pap smear for May 2026. - Continue regular dental  check-ups. - Maintain healthy lifestyle with exercise and diet.  BMI reduced to 27.5 with weight loss of 8-13 pounds. Engaging in high-intensity interval training and weight training 6 days a week. Dietary changes include increased protein intake and reduced processed foods. - Continue current exercise regimen and dietary modifications. - Monitor BMI and body fat percentage.   Allergic rhinitis, unspecified seasonality, unspecified trigger Assessment & Plan: Allergic rhinitis with Eustachian tube dysfunction and middle ear effusion, bilateral.  Intermittent right ear pain with fluid behind the eardrum, likely due to allergies. Symptoms include earache,  fluttering sensation, and swollen neck glands. Current treatment includes Flonase  and Claritin . Consideration of switching antihistamines due to potential tolerance. Singulair  considered as a step-up therapy if symptoms persist. - Switch from Claritin  to Allegra (fexofenadine) for a few weeks. - Continue Flonase , one squirt in each nostril daily. - Consider Singulair  (montelukast ) if no improvement with Allegra. Sent script for Singulair  to her pharmacy.  - Use Tylenol or ibuprofen  for pain management. - Perform steam inhalation to help clear eustachian tubes. - Use mini pot for drainage relief.  Orders: -     Fluticasone  Propionate; SPRAY 2 SPRAYS INTO EACH NOSTRIL EVERY DAY  Dispense: 16 mL; Refill: 11  Chronic anxiety Assessment & Plan:    Chronic anxiety Managed with alprazolam  as needed. Prescription refill issues resolved. She uses medication sparingly and mindfully. - Prescribed 15 tablets of alprazolam  0.25 mg for use as needed. PDMP reviewed - Advised to use medication sparingly and mindfully.   Other orders -     ALPRAZolam ; Take 1 tablet (0.25 mg total) by mouth as needed for up to 15 days for anxiety.  Dispense: 15 tablet; Refill: 0 -     Montelukast  Sodium; Take 1 tablet (10 mg total) by mouth at bedtime.  Dispense: 90 tablet; Refill: 0   Assessment and Plan   Overweight          Body mass index is 28.81 kg/m.   These are the goals we discussed:  Goals   None      This is a list of the screening recommended for you and due dates:  Health Maintenance  Topic Date Due   COVID-19 Vaccine (3 - Moderna risk series) 11/02/2024*   Flu Shot  03/18/2025*   DTaP/Tdap/Td vaccine (8 - Tdap) 04/29/2025*   Pneumococcal Vaccine (1 of 2 - PCV) 04/29/2025*   HPV Vaccine (1 - Risk 3-dose SCDM series) 08/06/2025*   Pap with HPV screening  05/13/2025   Hepatitis B Vaccine  Completed   Hepatitis C Screening  Completed   Meningitis B Vaccine  Aged Out   HIV Screening   Discontinued  *Topic was postponed. The date shown is not the original due date.     Meds ordered this encounter  Medications   ALPRAZolam  (XANAX ) 0.25 MG tablet    Sig: Take 1 tablet (0.25 mg total) by mouth as needed for up to 15 days for anxiety.    Dispense:  15 tablet    Refill:  0    Dx: Chronic anxiety F41.1   montelukast  (SINGULAIR ) 10 MG tablet    Sig: Take 1 tablet (10 mg total) by mouth at bedtime.    Dispense:  90 tablet    Refill:  0   fluticasone  (FLONASE ) 50 MCG/ACT nasal spray    Sig: SPRAY 2 SPRAYS INTO EACH NOSTRIL EVERY DAY    Dispense:  16 mL    Refill:  11    Follow-up: Return  in about 1 year (around 10/17/2025) for wellness exam with pap smear.  An After Visit Summary was printed and given to the patient.  Sarah Rufer, MD [facility] [phone]

## 2024-10-17 NOTE — Assessment & Plan Note (Signed)
 Allergic rhinitis with Eustachian tube dysfunction and middle ear effusion, bilateral.  Intermittent right ear pain with fluid behind the eardrum, likely due to allergies. Symptoms include earache, fluttering sensation, and swollen neck glands. Current treatment includes Flonase  and Claritin . Consideration of switching antihistamines due to potential tolerance. Singulair  considered as a step-up therapy if symptoms persist. - Switch from Claritin  to Allegra (fexofenadine) for a few weeks. - Continue Flonase , one squirt in each nostril daily. - Consider Singulair  (montelukast ) if no improvement with Allegra. Sent script for Singulair  to her pharmacy.  - Use Tylenol or ibuprofen  for pain management. - Perform steam inhalation to help clear eustachian tubes. - Use mini pot for drainage relief.

## 2024-10-17 NOTE — Assessment & Plan Note (Signed)
 General Health Maintenance Routine health maintenance discussed. Pap smear not needed until May 2026. Eye exam completed earlier this year. Dental visit due in December. No vaccines desired due to personal preference and past experiences. - Will schedule Pap smear for May 2026. - Continue regular dental check-ups. - Maintain healthy lifestyle with exercise and diet.  BMI reduced to 27.5 with weight loss of 8-13 pounds. Engaging in high-intensity interval training and weight training 6 days a week. Dietary changes include increased protein intake and reduced processed foods. - Continue current exercise regimen and dietary modifications. - Monitor BMI and body fat percentage.

## 2024-10-17 NOTE — Assessment & Plan Note (Signed)
  Chronic anxiety Managed with alprazolam  as needed. Prescription refill issues resolved. She uses medication sparingly and mindfully. - Prescribed 15 tablets of alprazolam  0.25 mg for use as needed. PDMP reviewed - Advised to use medication sparingly and mindfully.

## 2024-12-24 ENCOUNTER — Other Ambulatory Visit: Payer: Self-pay | Admitting: Medical Genetics

## 2024-12-24 DIAGNOSIS — Z006 Encounter for examination for normal comparison and control in clinical research program: Secondary | ICD-10-CM

## 2024-12-25 ENCOUNTER — Ambulatory Visit

## 2025-01-03 LAB — GENECONNECT MOLECULAR SCREEN: Genetic Analysis Overall Interpretation: NEGATIVE

## 2025-10-20 ENCOUNTER — Encounter
